# Patient Record
Sex: Male | Born: 1960 | Race: White | Hispanic: No | Marital: Married | Smoking: Never smoker
Health system: Southern US, Community
[De-identification: ages and names within clinical notes are randomized; demographics above are authoritative.]

## PROBLEM LIST (undated history)

## (undated) DIAGNOSIS — S83241A Other tear of medial meniscus, current injury, right knee, initial encounter: Secondary | ICD-10-CM

## (undated) DIAGNOSIS — T7840XA Allergy, unspecified, initial encounter: Secondary | ICD-10-CM

## (undated) DIAGNOSIS — K5150 Left sided colitis without complications: Secondary | ICD-10-CM

## (undated) DIAGNOSIS — K529 Noninfective gastroenteritis and colitis, unspecified: Secondary | ICD-10-CM

## (undated) DIAGNOSIS — M25571 Pain in right ankle and joints of right foot: Secondary | ICD-10-CM

## (undated) HISTORY — PX: NASAL SINUS SURGERY: SHX719

## (undated) HISTORY — DX: Left sided colitis without complications: K51.50

## (undated) HISTORY — DX: Noninfective gastroenteritis and colitis, unspecified: K52.9

## (undated) HISTORY — PX: LYMPH NODE BIOPSY: SHX201

## (undated) HISTORY — PX: TOOTH EXTRACTION: SUR596

## (undated) HISTORY — PX: TONSILLECTOMY: SUR1361

## (undated) HISTORY — PX: MOUTH SURGERY: SHX715

## (undated) HISTORY — DX: Allergy, unspecified, initial encounter: T78.40XA

## (undated) HISTORY — DX: Pain in right ankle and joints of right foot: M25.571

## (undated) HISTORY — PX: COLONOSCOPY: SHX174

---

## 2001-09-25 ENCOUNTER — Encounter: Admission: RE | Admit: 2001-09-25 | Discharge: 2001-09-25 | Payer: Self-pay | Admitting: Oncology

## 2001-09-25 ENCOUNTER — Encounter (HOSPITAL_COMMUNITY): Admission: RE | Admit: 2001-09-25 | Discharge: 2001-10-25 | Payer: Self-pay | Admitting: Oncology

## 2002-09-25 ENCOUNTER — Encounter: Admission: RE | Admit: 2002-09-25 | Discharge: 2002-09-25 | Payer: Self-pay | Admitting: Oncology

## 2002-10-30 ENCOUNTER — Encounter: Admission: RE | Admit: 2002-10-30 | Discharge: 2002-10-30 | Payer: Self-pay | Admitting: Oncology

## 2002-10-30 ENCOUNTER — Encounter (HOSPITAL_COMMUNITY): Admission: RE | Admit: 2002-10-30 | Discharge: 2002-11-29 | Payer: Self-pay | Admitting: Oncology

## 2004-12-03 ENCOUNTER — Ambulatory Visit (HOSPITAL_COMMUNITY): Payer: Self-pay | Admitting: Oncology

## 2004-12-03 ENCOUNTER — Encounter (HOSPITAL_COMMUNITY): Admission: RE | Admit: 2004-12-03 | Discharge: 2005-01-02 | Payer: Self-pay | Admitting: Oncology

## 2004-12-03 ENCOUNTER — Encounter: Admission: RE | Admit: 2004-12-03 | Discharge: 2004-12-03 | Payer: Self-pay | Admitting: Oncology

## 2006-07-12 ENCOUNTER — Encounter (HOSPITAL_COMMUNITY): Admission: RE | Admit: 2006-07-12 | Discharge: 2006-08-11 | Payer: Self-pay | Admitting: Oncology

## 2006-07-12 ENCOUNTER — Encounter: Admission: RE | Admit: 2006-07-12 | Discharge: 2006-07-12 | Payer: Self-pay | Admitting: Oncology

## 2006-07-12 ENCOUNTER — Ambulatory Visit (HOSPITAL_COMMUNITY): Payer: Self-pay | Admitting: Oncology

## 2006-07-13 ENCOUNTER — Ambulatory Visit (HOSPITAL_COMMUNITY): Admission: RE | Admit: 2006-07-13 | Discharge: 2006-07-13 | Payer: Self-pay | Admitting: Oncology

## 2007-02-04 IMAGING — CT CT ABDOMEN W/ CM
2 of 5 series · 16 of 46 positions shown, 18 images · IV contrast (APPLIED)
Comparison: None.

CLINICAL DATA: 45-year-old with left flank pain for 3 months.  Patient has a history of lymphoma.
 ABDOMEN CT WITH CONTRAST:
TECHNIQUE: Multidetector CT imaging of the abdomen was performed following the standard protocol during bolus administration of intravenous contrast.
 Contrast:  100 cc Omnipaque 300
TECHNIQUE: Multidetector CT imaging of the pelvis was performed following the standard protocol during bolus administration of intravenous contrast.

[Series 2: abd/pelv with 5.0 b31f st · axial · 0.72mm/px · z∈[-462,+28]mm · 13 of 110 slices shown, 15 images]
[im 6/110  soft-tissue]
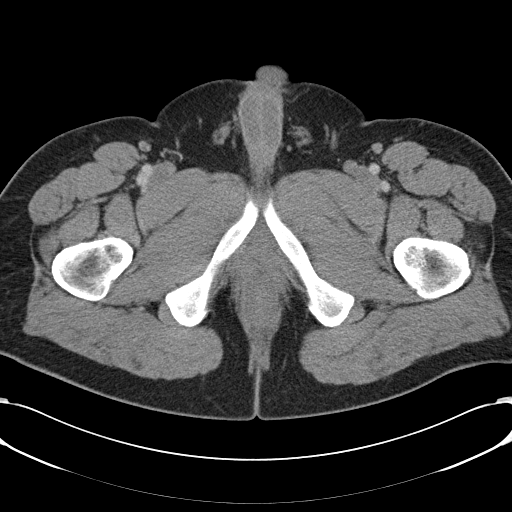
[im 6/110  bone]
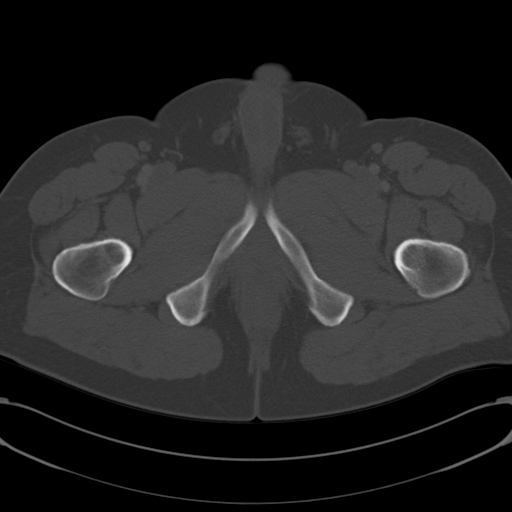
[im 18/110  soft-tissue]
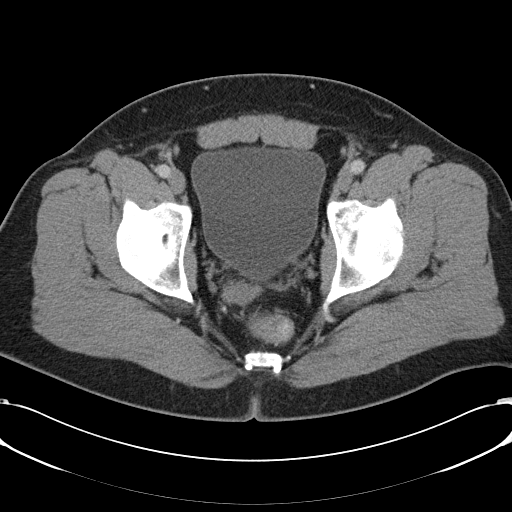
[im 23/110  soft-tissue]
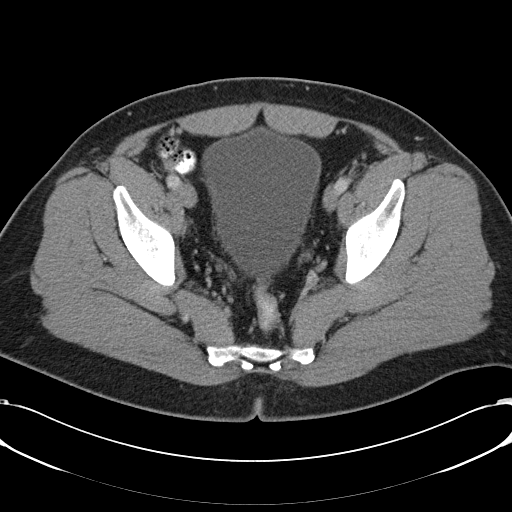
[im 29/110  soft-tissue]
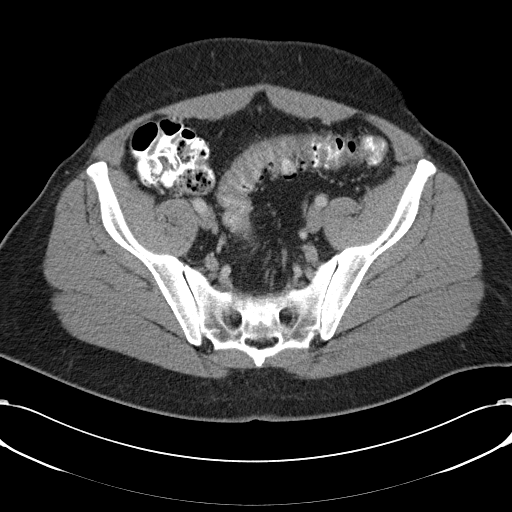
[im 41/110  soft-tissue]
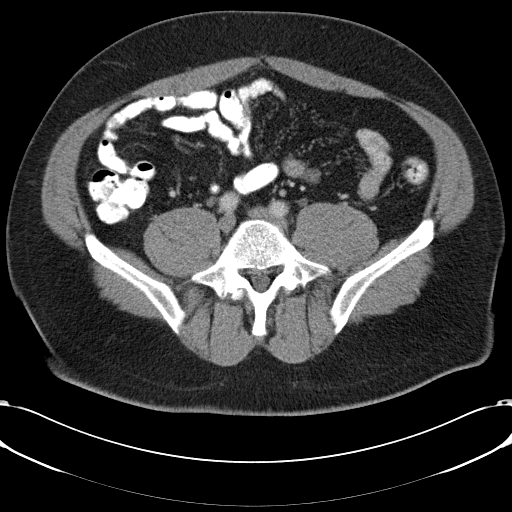
[im 46/110  soft-tissue]
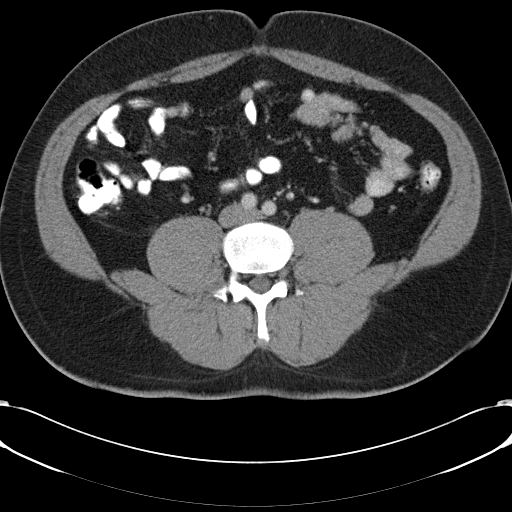
[im 58/110  soft-tissue]
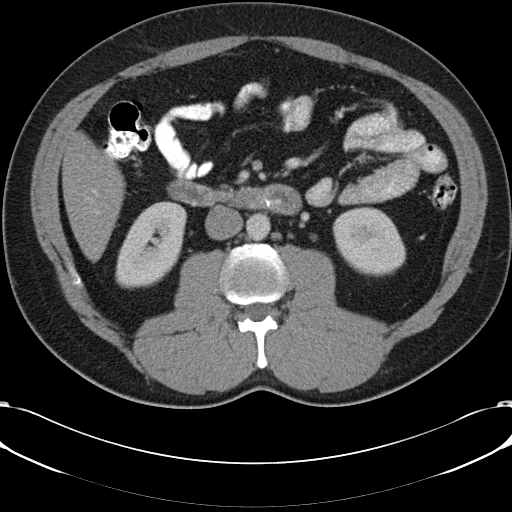
[im 64/110  soft-tissue]
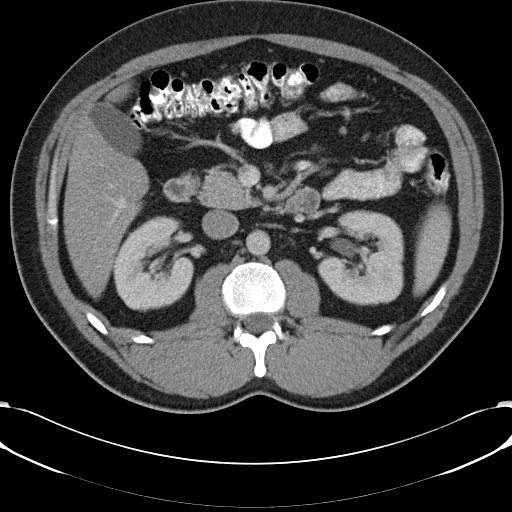
[im 69/110  soft-tissue]
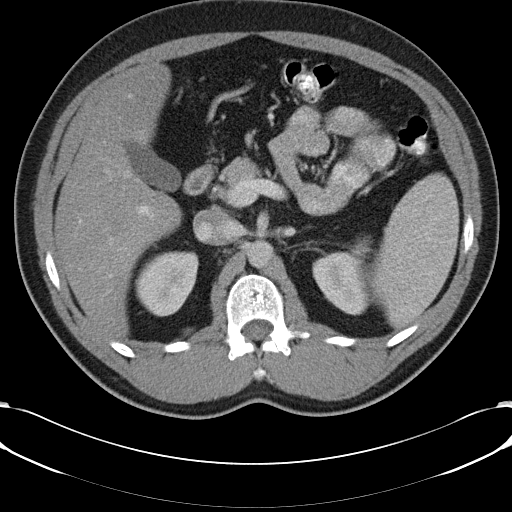
[im 69/110  bone]
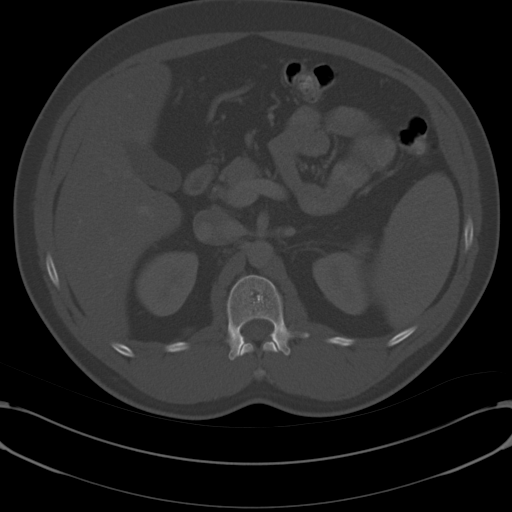
[im 81/110  soft-tissue]
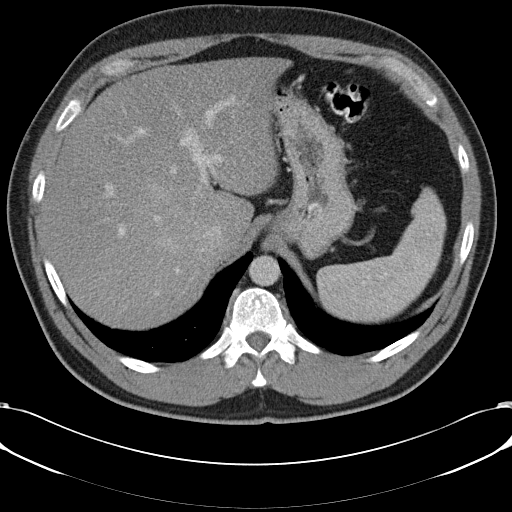
[im 87/110  soft-tissue]
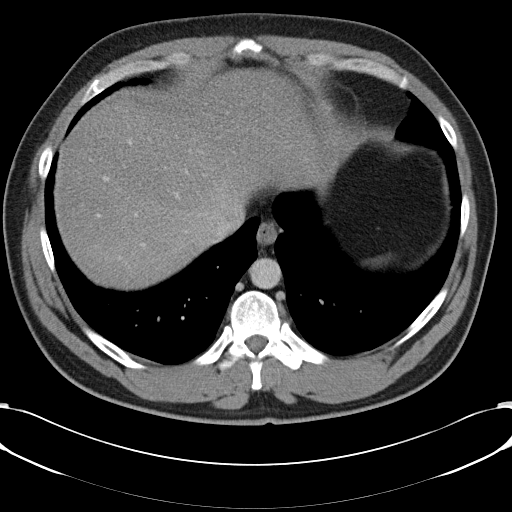
[im 92/110  soft-tissue]
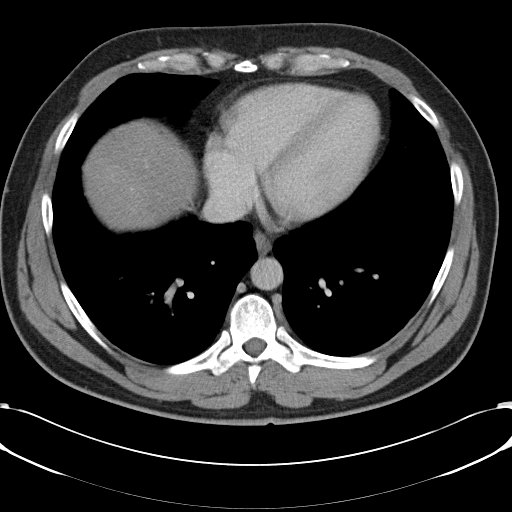
[im 104/110  soft-tissue]
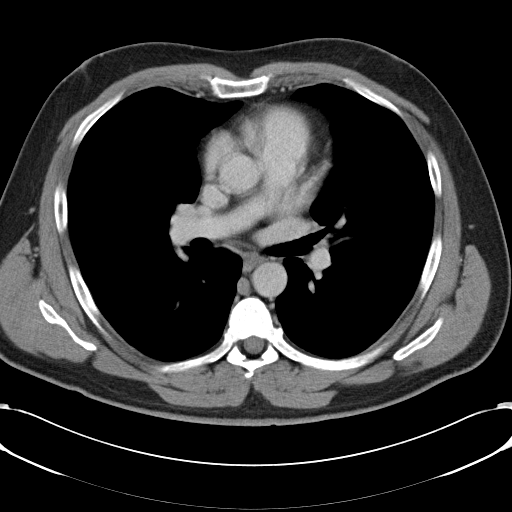

[Series 603: coronal · coronal · 1.08mm/px · 3 of 122 slices shown]
[im 41/122  soft-tissue]
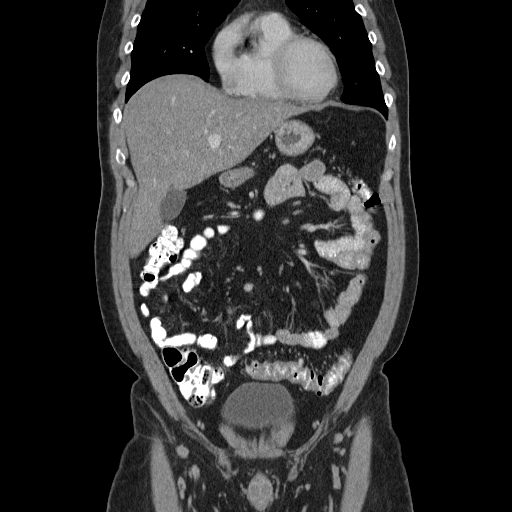
[im 54/122  soft-tissue]
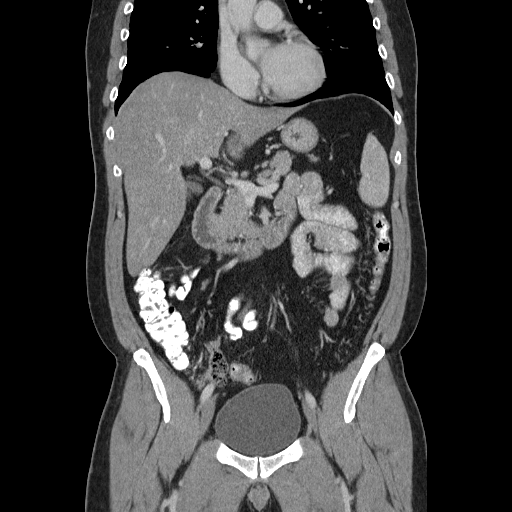
[im 68/122  soft-tissue]
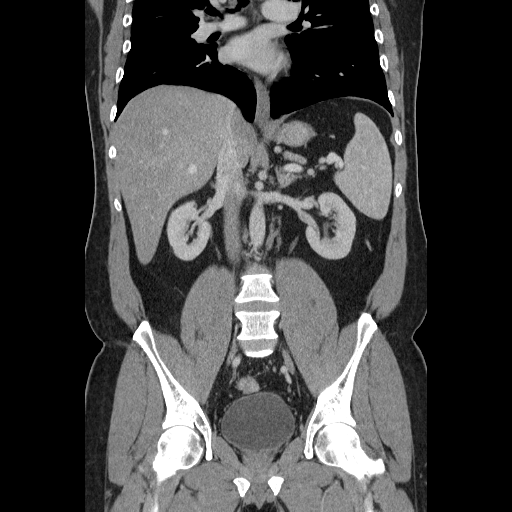

[16 of 46 positions shown; findings below may reference images not displayed]

FINDINGS: The lung bases are clear.  Heart size is normal.  No pericardial effusion.  The descending thoracic aorta is normal in caliber.  
 There is diffuse fatty infiltration of the liver with some areas of fatty sparing.  No focal hepatic lesions or intrahepatic ductal dilatation.  The spleen is within normal limits in size.  The pancreas, adrenal glands, and kidneys demonstrate no abnormalities.  
 The aorta is normal in caliber.  Major branch vessels are normal.  The portal and splenic veins are patent.  
 The stomach is not well distended with contrast, but no gross abnormalities are seen.  The duodenum, small bowel, and colon demonstrate no significant findings.  
 No mesenteric or retroperitoneal masses or adenopathy.  No significant bony findings.  There is a few small scattered hemangiomas.
IMPRESSION: Unremarkable CT examination of the abdomen.  No acute abdominal findings and no mass lesions.  
 PELVIS CT WITH CONTRAST:
FINDINGS: There is mild diffuse wall thickening involving the upper sigmoid colon with numerous diverticula.  Findings suggest mild colonic inflammation.  I do not see any discrete focus of diverticulitis.  The pericolonic fat planes are maintained.  No mass lesions are seen.  Bladder is normal.  The prostate gland and seminal vesicles appear normal.  No pelvic masses or adenopathy.  Terminal ileum appears normal.  
 No significant bony findings.
IMPRESSION: Mild diffuse inflammation of the sigmoid colon without discrete focus of diverticulitis.  No masses or adenopathy.

## 2012-06-08 ENCOUNTER — Encounter: Payer: Self-pay | Admitting: Internal Medicine

## 2012-07-24 ENCOUNTER — Encounter: Payer: Self-pay | Admitting: Internal Medicine

## 2012-07-24 ENCOUNTER — Ambulatory Visit (AMBULATORY_SURGERY_CENTER): Payer: 59

## 2012-07-24 VITALS — Ht 67.5 in | Wt 212.5 lb

## 2012-07-24 DIAGNOSIS — Z1211 Encounter for screening for malignant neoplasm of colon: Secondary | ICD-10-CM

## 2012-07-24 MED ORDER — NA SULFATE-K SULFATE-MG SULF 17.5-3.13-1.6 GM/177ML PO SOLN: 1.0000 | Freq: Once | ORAL | Status: DC

## 2012-07-26 ENCOUNTER — Telehealth: Payer: Self-pay | Admitting: Internal Medicine

## 2012-07-26 NOTE — Telephone Encounter (Signed)
Rx for Suprep called in to [facility] in [place], [place].  Pt notified

## 2012-08-06 ENCOUNTER — Encounter: Payer: Self-pay | Admitting: Internal Medicine

## 2012-08-13 ENCOUNTER — Ambulatory Visit (AMBULATORY_SURGERY_CENTER): Payer: 59 | Admitting: Internal Medicine

## 2012-08-13 ENCOUNTER — Encounter: Payer: Self-pay | Admitting: Internal Medicine

## 2012-08-13 VITALS — BP 123/86 | HR 80 | Temp 97.7°F | Resp 16 | Ht 67.5 in | Wt 212.0 lb

## 2012-08-13 DIAGNOSIS — D126 Benign neoplasm of colon, unspecified: Secondary | ICD-10-CM

## 2012-08-13 DIAGNOSIS — Z1211 Encounter for screening for malignant neoplasm of colon: Secondary | ICD-10-CM

## 2012-08-13 DIAGNOSIS — K5730 Diverticulosis of large intestine without perforation or abscess without bleeding: Secondary | ICD-10-CM

## 2012-08-13 MED ORDER — SODIUM CHLORIDE 0.9 % IV SOLN: 500.0000 mL | INTRAVENOUS | Status: DC

## 2012-08-13 NOTE — Op Note (Signed)
[facility] [address] [place], [address]   COLONOSCOPY PROCEDURE REPORT  PATIENT: Richard Carey, Richard Carey  MR#: [id number] BIRTHDATE: 1961/09/08 , 51  yrs. old GENDER: Male ENDOSCOPIST: Iva Boop, MD, [facility] REFERRED [id number] Lenise Arena, M.D. PROCEDURE DATE:  08/13/2012 PROCEDURE:   Colonoscopy with snare polypectomy ASA CLASS:   Class I INDICATIONS:average risk screening. MEDICATIONS: propofol (Diprivan) 400mg  IV, MAC sedation, administered by CRNA, and These medications were titrated to patient response per physician's verbal order  DESCRIPTION OF PROCEDURE:   After the risks benefits and alternatives of the procedure were thoroughly explained, informed consent was obtained.  A digital rectal exam revealed no abnormalities of the rectum and A digital rectal exam revealed the prostate was not enlarged.   The LB CF-[id number]  endoscope was introduced through the anus and advanced to the cecum, which was identified by both the appendix and ileocecal valve. No adverse events experienced.   The quality of the prep was Suprep good  The instrument was then slowly withdrawn as the colon was fully examined.      COLON FINDINGS: A diminutive polypoid shaped sessile polyp was found in the sigmoid colon.  A polypectomy was performed with a cold snare.  The resection was complete and the polyp tissue was partially retrieved.   Mild diverticulosis was noted in the sigmoid colon.   The colon mucosa was otherwise normal.  Retroflexed views in right colon and rectum revealed no abnormalities. The time to cecum=2 minutes 29 seconds.  Withdrawal time=14 minutes 06 seconds. The scope was withdrawn and the procedure completed. COMPLICATIONS: There were no complications.  ENDOSCOPIC IMPRESSION: 1.   Diminutive sessile polyp was found in the sigmoid colon; polypectomy was performed with a cold snare 2.   Mild diverticulosis was noted in the sigmoid colon 3.   The colon  mucosa was otherwise normal, good prep  RECOMMENDATIONS: Timing of repeat colonoscopy will be determined by pathology findings.5-10 years   eSigned:  Iva Boop, MD, [facility] 08/13/2012 4:25 PM   cc: [name], MD and The Patient

## 2012-08-13 NOTE — Progress Notes (Signed)
Propofol given and oxygen managed per J Mullens CRNA 

## 2012-08-13 NOTE — Patient Instructions (Addendum)
The colonoscopy showed one very small polyp (removed) and diverticulosis.  Please read the handouts provided.  Thank you for choosing me and [facility].  Iva Boop, MD, FACG   YOU HAD AN ENDOSCOPIC PROCEDURE TODAY AT THE [facility]: Refer to the procedure report that was given to you for any specific questions about what was found during the examination.  If the procedure report does not answer your questions, please call your gastroenterologist to clarify.  If you requested that your care partner not be given the details of your procedure findings, then the procedure report has been included in a sealed envelope for you to review at your convenience later.  YOU SHOULD EXPECT: Some feelings of bloating in the abdomen. Passage of more gas than usual.  Walking can help get rid of the air that was put into your GI tract during the procedure and reduce the bloating. If you had a lower endoscopy (such as a colonoscopy or flexible sigmoidoscopy) you may notice spotting of blood in your stool or on the toilet paper. If you underwent a bowel prep for your procedure, then you may not have a normal bowel movement for a few days.  DIET: Your first meal following the procedure should be a light meal and then it is ok to progress to your normal diet.  A half-sandwich or bowl of soup is an example of a good first meal.  Heavy or fried foods are harder to digest and may make you feel nauseous or bloated.  Likewise meals heavy in dairy and vegetables can cause extra gas to form and this can also increase the bloating.  Drink plenty of fluids but you should avoid alcoholic beverages for 24 hours.  ACTIVITY: Your care partner should take you home directly after the procedure.  You should plan to take it easy, moving slowly for the rest of the day.  You can resume normal activity the day after the procedure however you should NOT DRIVE or use heavy machinery for 24 hours (because of the  sedation medicines used during the test).    SYMPTOMS TO REPORT IMMEDIATELY: A gastroenterologist can be reached at any hour.  During normal business hours, 8:30 AM to 5:00 PM Monday through Friday, call [phone].  After hours and on weekends, please call the GI answering service at [phone] who will take a message and have the physician on call contact you.   Following lower endoscopy (colonoscopy or flexible sigmoidoscopy):  Excessive amounts of blood in the stool  Significant tenderness or worsening of abdominal pains  Swelling of the abdomen that is new, acute  Fever of 100F or higher  Following upper endoscopy (EGD)  Vomiting of blood or coffee ground material  New chest pain or pain under the shoulder blades  Painful or persistently difficult swallowing  New shortness of breath  Fever of 100F or higher  Black, tarry-looking stools  FOLLOW UP: If any biopsies were taken you will be contacted by phone or by letter within the next 1-3 weeks.  Call your gastroenterologist if you have not heard about the biopsies in 3 weeks.  Our staff will call the home number listed on your records the next business day following your procedure to check on you and address any questions or concerns that you may have at that time regarding the information given to you following your procedure. This is a courtesy call and so if there is no answer at the home number  and we have not heard from you through the emergency physician on call, we will assume that you have returned to your regular daily activities without incident.  SIGNATURES/CONFIDENTIALITY: You and/or your care partner have signed paperwork which will be entered into your electronic medical record.  These signatures attest to the fact that that the information above on your After Visit Summary has been reviewed and is understood.  Full responsibility of the confidentiality of this discharge information lies with you and/or your  care-partner.

## 2012-08-14 ENCOUNTER — Telehealth: Payer: Self-pay | Admitting: *Deleted

## 2012-08-14 NOTE — Telephone Encounter (Signed)
  Follow up Call-  Call back number 08/13/2012  Post procedure Call Back phone  # [phone]  Permission to leave phone message Yes     [facility]

## 2012-08-21 ENCOUNTER — Encounter: Payer: Self-pay | Admitting: Internal Medicine

## 2012-08-21 NOTE — Progress Notes (Signed)
Quick Note:  Hyperplastic sigmoid polyp Repeat colonoscopy 2023 ______

## 2013-04-24 ENCOUNTER — Encounter (HOSPITAL_COMMUNITY): Payer: Self-pay | Admitting: Oncology

## 2013-04-24 ENCOUNTER — Encounter (HOSPITAL_COMMUNITY): Payer: 59 | Attending: Oncology | Admitting: Oncology

## 2013-04-24 VITALS — BP 142/81 | HR 73 | Temp 98.0°F | Resp 18 | Ht 68.0 in | Wt 216.8 lb

## 2013-04-24 DIAGNOSIS — D1779 Benign lipomatous neoplasm of other sites: Secondary | ICD-10-CM

## 2013-04-24 DIAGNOSIS — R229 Localized swelling, mass and lump, unspecified: Secondary | ICD-10-CM

## 2013-04-24 NOTE — Progress Notes (Signed)
This is a very pleasant gentleman who I have known intermittently for many years. 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 It was not clear whether this was really cancer versus atypical cells, that were reactive. No therapy was administered. He was observed only and no recurrences surfaced.  I have not seen him since 2007. 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 He is here today because he is concerned about it changed.  He has no positive findings otherwise on review of systems. He is overweight for her his height. He does need to lose weight. He states that once a glucose was slightly elevated. His family physician has been discussing weight reduction etc. with him. Both his wife and his daughter are overweight as well he states of the need to have a change in lifestyle realistically.  He is working full-time. He has no B. symptomatology. He had colonoscopy last year which was unremarkable. Other than very rarely and on social occasions only.  Parents are both still alive. His father has stage 0 CLL he tells me today and peripheral neuropathy. His mother is in good health. He also has a sister in good health who lives in [place].  BP 142/81  Pulse 73  Temp(Src) 98 F (36.7 C) (Oral)  Resp 18  Ht 5\' 8"  (1.727 m)  Wt 216 lb 12.8 oz (98.34 kg)  BMI 32.97 kg/m2  He is in no acute distress. He has no palpable lymphadenopathy in the cervical, supraclavicular, infraclavicular, or axillary areas. I believe he has a right sided 6-7 mm lipoma-like lesion medial to the nipple areolar complex on the right. He has a 1.5 x 1.0 cm left upper abdominal lipoma. There is a questionable periumbilical 5-6 mm nodule suspicious for a lipoma. He does not have hepatosplenomegaly. Bowel sounds are normal. Lungs are clear to auscultation and percussion. Heart exam shows a  regular rhythm and rate without murmur rub or gallop. Abdomen is soft and nontender. Bowel sounds are diminished but present. He has no arm or leg edema. Pupils are equally round and reactive to light. Teeth are in good repair. Throat is clear. Facial symmetry is intact. There is no thyromegaly. Skin exam otherwise is negative.  I do not think there is anything to worry about with this gentleman at this time. He does need to lose weight and exercise. If he thinks things are changing in his abdominal wall, he is free to come back at any time. I do not think blood work is indicated at this time.

## 2013-04-24 NOTE — Patient Instructions (Addendum)
[facility] Discharge Instructions  RECOMMENDATIONS MADE BY THE CONSULTANT AND ANY TEST RESULTS WILL BE SENT TO YOUR REFERRING PHYSICIAN.  EXAM FINDINGS BY THE PHYSICIAN TODAY AND SIGNS OR SYMPTOMS TO REPORT TO CLINIC OR PRIMARY PHYSICIAN: Exam and discussion by MD.  Area is a lipoma and don't need to do anything.  MEDICATIONS PRESCRIBED:  none    SPECIAL INSTRUCTIONS/FOLLOW-UP: Follow-up as needed.  Thank you for choosing [facility] to provide your oncology and hematology care.  To afford each patient quality time with our providers, please arrive at least 15 minutes before your scheduled appointment time.  With your help, our goal is to use those 15 minutes to complete the necessary work-up to ensure our physicians have the information they need to help with your evaluation and healthcare recommendations.    Effective January 1st, 2014, we ask that you re-schedule your appointment with our physicians should you arrive 10 or more minutes late for your appointment.  We strive to give you quality time with our providers, and arriving late affects you and other patients whose appointments are after yours.    Again, thank you for choosing [facility].  Our hope is that these requests will decrease the amount of time that you wait before being seen by our physicians.       _____________________________________________________________  Should you have questions after your visit to [facility], please contact our office at [phone] between the hours of 8:30 a.m. and 5:00 p.m.  Voicemails left after 4:30 p.m. will not be returned until the following business day.  For prescription refill requests, have your pharmacy contact our office with your prescription refill request.

## 2014-02-15 ENCOUNTER — Emergency Department (HOSPITAL_BASED_OUTPATIENT_CLINIC_OR_DEPARTMENT_OTHER)
Admission: EM | Admit: 2014-02-15 | Discharge: 2014-02-15 | Disposition: A | Discharge status: Home / Self Care | Payer: 59 | Attending: Emergency Medicine | Admitting: Emergency Medicine

## 2014-02-15 ENCOUNTER — Encounter (HOSPITAL_BASED_OUTPATIENT_CLINIC_OR_DEPARTMENT_OTHER): Payer: Self-pay | Admitting: Emergency Medicine

## 2014-02-15 DIAGNOSIS — Y929 Unspecified place or not applicable: Secondary | ICD-10-CM | POA: Insufficient documentation

## 2014-02-15 DIAGNOSIS — IMO0002 Reserved for concepts with insufficient information to code with codable children: Secondary | ICD-10-CM | POA: Insufficient documentation

## 2014-02-15 DIAGNOSIS — Z8619 Personal history of other infectious and parasitic diseases: Secondary | ICD-10-CM | POA: Insufficient documentation

## 2014-02-15 DIAGNOSIS — S61219A Laceration without foreign body of unspecified finger without damage to nail, initial encounter: Secondary | ICD-10-CM

## 2014-02-15 DIAGNOSIS — S61209A Unspecified open wound of unspecified finger without damage to nail, initial encounter: Secondary | ICD-10-CM | POA: Insufficient documentation

## 2014-02-15 DIAGNOSIS — W298XXA Contact with other powered powered hand tools and household machinery, initial encounter: Secondary | ICD-10-CM | POA: Insufficient documentation

## 2014-02-15 DIAGNOSIS — Z79899 Other long term (current) drug therapy: Secondary | ICD-10-CM | POA: Insufficient documentation

## 2014-02-15 DIAGNOSIS — Z23 Encounter for immunization: Secondary | ICD-10-CM | POA: Insufficient documentation

## 2014-02-15 DIAGNOSIS — Y9389 Activity, other specified: Secondary | ICD-10-CM | POA: Insufficient documentation

## 2014-02-15 MED ORDER — TETANUS-DIPHTH-ACELL PERTUSSIS 5-2.5-18.5 LF-MCG/0.5 IM SUSP
0.5000 mL | Freq: Once | INTRAMUSCULAR | Status: AC
  Administered 2014-02-15: 0.5 mL via INTRAMUSCULAR
  Filled 2014-02-15: qty 0.5

## 2014-02-15 MED ORDER — CLINDAMYCIN HCL 300 MG PO CAPS: 300.0000 mg | ORAL_CAPSULE | Freq: Four times a day (QID) | ORAL | Status: DC

## 2014-02-15 NOTE — Discharge Instructions (Signed)
Bacitracin dressings twice daily.  Clindamycin as prescribed.  Return to the ER for signs of infection including increased redness, pus draining from wound, streaks up the arm, or any new or concerning symptoms.   Laceration Care, Adult A laceration is a cut or lesion that goes through all layers of the skin and into the tissue just beneath the skin. TREATMENT  Some lacerations may not require closure. Some lacerations may not be able to be closed due to an increased risk of infection. It is important to see your caregiver as soon as possible after an injury to minimize the risk of infection and maximize the opportunity for successful closure. If closure is appropriate, pain medicines may be given, if needed. The wound will be cleaned to help prevent infection. Your caregiver will use stitches (sutures), staples, wound glue (adhesive), or skin adhesive strips to repair the laceration. These tools bring the skin edges together to allow for faster healing and a better cosmetic outcome. However, all wounds will heal with a scar. Once the wound has healed, scarring can be minimized by covering the wound with sunscreen during the day for 1 full year. HOME CARE INSTRUCTIONS  For sutures or staples:  Keep the wound clean and dry.  If you were given a bandage (dressing), you should change it at least once a day. Also, change the dressing if it becomes wet or dirty, or as directed by your caregiver.  Wash the wound with soap and water 2 times a day. Rinse the wound off with water to remove all soap. Pat the wound dry with a clean towel.  After cleaning, apply a thin layer of the antibiotic ointment as recommended by your caregiver. This will help prevent infection and keep the dressing from sticking.  You may shower as usual after the first 24 hours. Do not soak the wound in water until the sutures are removed.  Only take over-the-counter or prescription medicines for pain, discomfort, or fever as  directed by your caregiver.  Get your sutures or staples removed as directed by your caregiver. For skin adhesive strips:  Keep the wound clean and dry.  Do not get the skin adhesive strips wet. You may bathe carefully, using caution to keep the wound dry.  If the wound gets wet, pat it dry with a clean towel.  Skin adhesive strips will fall off on their own. You may trim the strips as the wound heals. Do not remove skin adhesive strips that are still stuck to the wound. They will fall off in time. For wound adhesive:  You may briefly wet your wound in the shower or bath. Do not soak or scrub the wound. Do not swim. Avoid periods of heavy perspiration until the skin adhesive has fallen off on its own. After showering or bathing, gently pat the wound dry with a clean towel.  Do not apply liquid medicine, cream medicine, or ointment medicine to your wound while the skin adhesive is in place. This may loosen the film before your wound is healed.  If a dressing is placed over the wound, be careful not to apply tape directly over the skin adhesive. This may cause the adhesive to be pulled off before the wound is healed.  Avoid prolonged exposure to sunlight or tanning lamps while the skin adhesive is in place. Exposure to ultraviolet light in the first year will darken the scar.  The skin adhesive will usually remain in place for 5 to 10 days, then naturally  fall off the skin. Do not pick at the adhesive film. You may need a tetanus shot if:  You cannot remember when you had your last tetanus shot.  You have never had a tetanus shot. If you get a tetanus shot, your arm may swell, get red, and feel warm to the touch. This is common and not a problem. If you need a tetanus shot and you choose not to have one, there is a rare chance of getting tetanus. Sickness from tetanus can be serious. SEEK MEDICAL CARE IF:   You have redness, swelling, or increasing pain in the wound.  You see a red  line that goes away from the wound.  You have yellowish-white fluid (pus) coming from the wound.  You have a fever.  You notice a bad smell coming from the wound or dressing.  Your wound breaks open before or after sutures have been removed.  You notice something coming out of the wound such as wood or glass.  Your wound is on your hand or foot and you cannot move a finger or toe. SEEK IMMEDIATE MEDICAL CARE IF:   Your pain is not controlled with prescribed medicine.  You have severe swelling around the wound causing pain and numbness or a change in color in your arm, hand, leg, or foot.  Your wound splits open and starts bleeding.  You have worsening numbness, weakness, or loss of function of any joint around or beyond the wound.  You develop painful lumps near the wound or on the skin anywhere on your body. MAKE SURE YOU:   Understand these instructions.  Will watch your condition.  Will get help right away if you are not doing well or get worse. Document Released: 11/07/2005 Document Revised: 01/30/2012 Document Reviewed: 05/03/2011 [facility] Patient Information 2014 [place], [place].

## 2014-02-15 NOTE — ED Notes (Signed)
Pt cut right pinky finger with chainsaw

## 2014-02-15 NOTE — ED Provider Notes (Signed)
CSN: [id number]     Arrival date & time 02/15/14  2000 History  This chart was scribed for [name] found by Terressa Koyanagi, ED Scribe. This patient was seen in room MH09/MH09 and the patient's care was started at 9:45 PM.  PCP: Orpah Melter, MD   Chief Complaint  Patient presents with  . Extremity Laceration   The history is provided by the patient. No language interpreter was used.   HPI Comments: Richard Carey is a 53 y.o. male who presents to the Emergency Department complaining of a laceration to his right fifth finger onset earlier this evening. Pt reports that he lacerated his right fifth finger when he was cleaning his chainsaw earlier this evening. Pt reports his last tetanus shot was in 2004. Pt is currently on prednisone for a finger infection.   Past Medical History  Diagnosis Date  . Ankle pain, right    Past Surgical History  Procedure Laterality Date  . Tooth extraction    . Tonsillectomy    . Nasal sinus surgery      sinus scraped /1991  . Lymph node biopsy      left side neck   Family History  Problem Relation Age of Onset  . Aneurysm Paternal Grandfather    History  Substance Use Topics  . Smoking status: Never Smoker   . Smokeless tobacco: Never Used  . Alcohol Use: No    Review of Systems  Skin: Positive for wound (laceration of right fifth finger).  All other systems reviewed and are negative.   A complete 10 system review of systems was obtained and all systems are negative except as noted in the HPI and PMH.    Allergies  Keflex  Home Medications   Current Outpatient Rx  Name  Route  Sig  Dispense  Refill  . diphenhydramine-acetaminophen (TYLENOL PM) 25-500 MG TABS   Oral   Take 1 tablet by mouth at bedtime as needed.         . fish oil-omega-3 fatty acids 1000 MG capsule   Oral   Take 1,400 mg by mouth daily.         Marland Kitchen glucosamine-chondroitin 500-400 MG tablet   Oral   Take 1 tablet by mouth 2 (two) times daily.          . Multiple Vitamin (MULTIVITAMIN) tablet   Oral   Take 1 tablet by mouth daily. Complete  MVI-Take one daily         . predniSONE (STERAPRED UNI-PAK) 10 MG tablet   Oral   Take by mouth daily.         . CHROMIUM-CINNAMON PO   Oral   Take 500 mg by mouth 2 (two) times daily.         . niacin 500 MG tablet   Oral   Take 500 mg by mouth 2 (two) times daily.         . Potassium Gluconate 595 MG CAPS   Oral   Take by mouth 2 (two) times daily.          BP 124/72  Pulse 89  Temp(Src) 98.3 F (36.8 C) (Oral)  Resp 18  Ht 5\' 8"  (1.727 m)  Wt 220 lb (99.791 kg)  BMI 33.46 kg/m2  SpO2 96% Physical Exam  Nursing note and vitals reviewed. Constitutional: He is oriented to person, place, and time. He appears well-developed and well-nourished. No distress.  HENT:  Head: Normocephalic and atraumatic.  Eyes:  EOM are normal.  Neck: Neck supple. No tracheal deviation present.  Cardiovascular: Normal rate.   Pulmonary/Chest: Effort normal. No respiratory distress.  Musculoskeletal: Normal range of motion.  Right fifth finger is noted to have multiple, superficial, lacerations to the lateral aspect. No tendon involvement. Capillary refill is brisk.   Neurological: He is alert and oriented to person, place, and time.  Skin: Skin is warm and dry.  Psychiatric: He has a normal mood and affect. His behavior is normal.    ED Course  Procedures (including critical care time) DIAGNOSTIC STUDIES: Oxygen Saturation is 96% on RA, adequate by my interpretation.    COORDINATION OF CARE:  9:48 PM-Discussed treatment plan which includes wound care and antibiotics with pt at bedside and pt agreed to plan.   Labs Review Labs Reviewed - No data to display Imaging Review No results found.   EKG Interpretation None      MDM   Final diagnoses:  None    Patient is a 53 year old male presents with lacerations to the right fifth finger he sustained while repairing a  chainsaw. The finger has several superficial jagged lacerations that do not require suture. The wounds were cleansed and dressed and should heal up just fine. He will be prescribed an antibiotic due to the dirty nature of the wound and advised to return if he develops additional signs of infection or other problems. His tetanus was updated.  I personally performed the services described in this documentation, which was scribed in my presence. The recorded information has been reviewed and is accurate.      Veryl Speak, MD 02/15/14 [phone]

## 2015-08-31 ENCOUNTER — Other Ambulatory Visit: Payer: Self-pay | Admitting: Orthopedic Surgery

## 2015-09-02 ENCOUNTER — Encounter (HOSPITAL_BASED_OUTPATIENT_CLINIC_OR_DEPARTMENT_OTHER): Payer: Self-pay | Admitting: *Deleted

## 2015-09-08 NOTE — H&P (Signed)
Richard Carey is an 54 y.o. male.    Chief Complaint: Right Knee Pain  HPI: Patient is here today for followup on right knee pain.  He was last seen on 07/30/15 at which time we elected to order an MRI for suspected medial meniscus tear.  He is here today to review the results of the MRI.  He states that he's had continued symptoms in the right knee.  He denies any new injury.  He does mention that when he is in [place] last week he did tweak his knee again and it hurt for quite a while.  He is currently wearing a hinged knee brace and also use an Ace single-point cane for assistance.  He does do a lot of traveling for his job and will be in [place] next week.  He denies any fevers chills night sweats or other signs of infection.  Past Medical History  Diagnosis Date  . Ankle pain, right   . Acute medial meniscus tear of right knee     Past Surgical History  Procedure Laterality Date  . Tooth extraction    . Tonsillectomy    . Nasal sinus surgery      sinus scraped /1991  . Lymph node biopsy      left side neck  . Mouth surgery      Family History  Problem Relation Age of Onset  . Aneurysm Paternal Grandfather    Social History:  reports that he has never smoked. He has never used smokeless tobacco. He reports that he drinks alcohol. He reports that he does not use illicit drugs.  Allergies:  Allergies  Allergen Reactions  . Keflex [Cephalexin] Hives    No prescriptions prior to admission    No results found for this or any previous visit (from the past 59 hour(s)). No results found.  Review of Systems  Constitutional: Negative.   HENT: Positive for tinnitus.   Eyes: Negative.   Respiratory: Negative.   Cardiovascular: Negative.   Gastrointestinal: Positive for diarrhea.  Genitourinary: Negative.   Musculoskeletal: Positive for joint pain.  Skin: Negative.   Neurological: Negative.   Endo/Heme/Allergies: Negative.   Psychiatric/Behavioral: Negative.      Height 5\' 8"  (1.727 m), weight 102.059 kg (225 lb). Physical Exam  Constitutional: He is oriented to person, place, and time. He appears well-developed and well-nourished.  HENT:  Head: Normocephalic and atraumatic.  Eyes: Pupils are equal, round, and reactive to light.  Neck: Normal range of motion. Neck supple.  Cardiovascular: Intact distal pulses.   Respiratory: Effort normal.  Musculoskeletal: He exhibits tenderness.  he has continued tenderness over the medial joint line.  No instability.  His calves are soft and nontender.  Again continued pain with extremes of motion.  Neurological: He is alert and oriented to person, place, and time.  Skin: Skin is warm and dry.  Psychiatric: He has a normal mood and affect. His behavior is normal. Judgment and thought content normal.     Assessment/Plan Assess: Right knee medial meniscal tear  Plan: Treatment options are discussed with the patient.  This patient is also discussed with Dr. Mayer Camel who also discusses treatment options.  He wishes to proceed with surgical intervention.  The benefits risks and potential competitions of surgery are discussed.  We anticipate seeing the patient back at time of surgical intervention.  A posting slip is completed and he is to do discuss timing with Sandi Raveling our surgery scheduler.  Call with any  issues.  Again we anticipate seeing the patient back at time of surgical intervention. Shyler Holzman R 09/08/2015, 12:37 PM

## 2015-09-09 ENCOUNTER — Encounter (HOSPITAL_BASED_OUTPATIENT_CLINIC_OR_DEPARTMENT_OTHER)
Admission: RE | Disposition: A | Discharge status: Home / Self Care | Payer: Self-pay | Source: Ambulatory Visit | Attending: Orthopedic Surgery

## 2015-09-09 ENCOUNTER — Ambulatory Visit (HOSPITAL_BASED_OUTPATIENT_CLINIC_OR_DEPARTMENT_OTHER)
Admission: RE | Admit: 2015-09-09 | Discharge: 2015-09-09 | Disposition: A | Discharge status: Home / Self Care | Payer: 59 | Source: Ambulatory Visit | Attending: Orthopedic Surgery | Admitting: Orthopedic Surgery

## 2015-09-09 ENCOUNTER — Ambulatory Visit (HOSPITAL_BASED_OUTPATIENT_CLINIC_OR_DEPARTMENT_OTHER): Payer: 59 | Admitting: Anesthesiology

## 2015-09-09 ENCOUNTER — Encounter (HOSPITAL_BASED_OUTPATIENT_CLINIC_OR_DEPARTMENT_OTHER): Payer: Self-pay | Admitting: Anesthesiology

## 2015-09-09 DIAGNOSIS — E669 Obesity, unspecified: Secondary | ICD-10-CM | POA: Insufficient documentation

## 2015-09-09 DIAGNOSIS — Z6834 Body mass index (BMI) 34.0-34.9, adult: Secondary | ICD-10-CM | POA: Insufficient documentation

## 2015-09-09 DIAGNOSIS — M2341 Loose body in knee, right knee: Secondary | ICD-10-CM | POA: Insufficient documentation

## 2015-09-09 DIAGNOSIS — S83241A Other tear of medial meniscus, current injury, right knee, initial encounter: Secondary | ICD-10-CM | POA: Diagnosis present

## 2015-09-09 DIAGNOSIS — X58XXXA Exposure to other specified factors, initial encounter: Secondary | ICD-10-CM | POA: Insufficient documentation

## 2015-09-09 DIAGNOSIS — M2241 Chondromalacia patellae, right knee: Secondary | ICD-10-CM | POA: Insufficient documentation

## 2015-09-09 HISTORY — DX: Other tear of medial meniscus, current injury, right knee, initial encounter: S83.241A

## 2015-09-09 HISTORY — PX: KNEE ARTHROSCOPY: SHX127

## 2015-09-09 SURGERY — ARTHROSCOPY, KNEE
Anesthesia: General | Site: Knee | Laterality: Right

## 2015-09-09 MED ORDER — ONDANSETRON HCL 4 MG/2ML IJ SOLN
INTRAMUSCULAR | Status: DC | PRN
  Administered 2015-09-09: 4 mg via INTRAVENOUS

## 2015-09-09 MED ORDER — DEXAMETHASONE SODIUM PHOSPHATE 10 MG/ML IJ SOLN
INTRAMUSCULAR | Status: AC
  Filled 2015-09-09: qty 1

## 2015-09-09 MED ORDER — DEXAMETHASONE SODIUM PHOSPHATE 10 MG/ML IJ SOLN
INTRAMUSCULAR | Status: DC | PRN
  Administered 2015-09-09: 10 mg via INTRAVENOUS

## 2015-09-09 MED ORDER — FENTANYL CITRATE (PF) 100 MCG/2ML IJ SOLN
50.0000 ug | INTRAMUSCULAR | Status: DC | PRN
  Administered 2015-09-09: 100 ug via INTRAVENOUS

## 2015-09-09 MED ORDER — BUPIVACAINE HCL (PF) 0.5 % IJ SOLN
INTRAMUSCULAR | Status: AC
  Filled 2015-09-09: qty 30

## 2015-09-09 MED ORDER — GLYCOPYRROLATE 0.2 MG/ML IJ SOLN: 0.2000 mg | Freq: Once | INTRAMUSCULAR | Status: DC | PRN

## 2015-09-09 MED ORDER — PROPOFOL 10 MG/ML IV BOLUS
INTRAVENOUS | Status: DC | PRN
  Administered 2015-09-09: 200 mg via INTRAVENOUS

## 2015-09-09 MED ORDER — FENTANYL CITRATE (PF) 100 MCG/2ML IJ SOLN
INTRAMUSCULAR | Status: AC
  Filled 2015-09-09: qty 4

## 2015-09-09 MED ORDER — PROPOFOL 10 MG/ML IV BOLUS
INTRAVENOUS | Status: AC
  Filled 2015-09-09: qty 20

## 2015-09-09 MED ORDER — LACTATED RINGERS IV SOLN
INTRAVENOUS | Status: DC
  Administered 2015-09-09: 11:00:00 via INTRAVENOUS

## 2015-09-09 MED ORDER — HYDROCODONE-ACETAMINOPHEN 5-325 MG PO TABS: 1.0000 | ORAL_TABLET | Freq: Four times a day (QID) | ORAL | Status: DC | PRN

## 2015-09-09 MED ORDER — EPINEPHRINE HCL 1 MG/ML IJ SOLN
INTRAMUSCULAR | Status: DC | PRN
  Administered 2015-09-09: 1 mg

## 2015-09-09 MED ORDER — MIDAZOLAM HCL 2 MG/2ML IJ SOLN
INTRAMUSCULAR | Status: AC
  Filled 2015-09-09: qty 4

## 2015-09-09 MED ORDER — SODIUM CHLORIDE 0.9 % IR SOLN
Status: DC | PRN
  Administered 2015-09-09: 1000 mL

## 2015-09-09 MED ORDER — SCOPOLAMINE 1 MG/3DAYS TD PT72: 1.0000 | MEDICATED_PATCH | Freq: Once | TRANSDERMAL | Status: DC | PRN

## 2015-09-09 MED ORDER — BUPIVACAINE-EPINEPHRINE 0.5% -1:200000 IJ SOLN
INTRAMUSCULAR | Status: DC | PRN
  Administered 2015-09-09: 20 mL

## 2015-09-09 MED ORDER — BUPIVACAINE-EPINEPHRINE (PF) 0.5% -1:200000 IJ SOLN
INTRAMUSCULAR | Status: AC
  Filled 2015-09-09: qty 30

## 2015-09-09 MED ORDER — LIDOCAINE HCL (CARDIAC) 20 MG/ML IV SOLN
INTRAVENOUS | Status: AC
  Filled 2015-09-09: qty 5

## 2015-09-09 MED ORDER — LIDOCAINE HCL (CARDIAC) 20 MG/ML IV SOLN
INTRAVENOUS | Status: DC | PRN
Start: 2015-09-09 — End: 2015-09-09
  Administered 2015-09-09: 50 mg via INTRAVENOUS

## 2015-09-09 MED ORDER — CHLORHEXIDINE GLUCONATE 4 % EX LIQD: 60.0000 mL | Freq: Once | CUTANEOUS | Status: DC

## 2015-09-09 MED ORDER — EPINEPHRINE HCL 1 MG/ML IJ SOLN
INTRAMUSCULAR | Status: AC
  Filled 2015-09-09: qty 1

## 2015-09-09 MED ORDER — DEXTROSE-NACL 5-0.45 % IV SOLN: INTRAVENOUS | Status: DC

## 2015-09-09 MED ORDER — MIDAZOLAM HCL 2 MG/2ML IJ SOLN
1.0000 mg | INTRAMUSCULAR | Status: DC | PRN
  Administered 2015-09-09: 2 mg via INTRAVENOUS

## 2015-09-09 MED ORDER — VANCOMYCIN HCL IN DEXTROSE 1-5 GM/200ML-% IV SOLN
1000.0000 mg | INTRAVENOUS | Status: AC
  Administered 2015-09-09: 1000 mg via INTRAVENOUS

## 2015-09-09 MED ORDER — VANCOMYCIN HCL IN DEXTROSE 1-5 GM/200ML-% IV SOLN
INTRAVENOUS | Status: AC
  Filled 2015-09-09: qty 200

## 2015-09-09 SURGICAL SUPPLY — 38 items
BANDAGE ELASTIC 6 VELCRO ST LF (GAUZE/BANDAGES/DRESSINGS) ×2 IMPLANT
BLADE 4.2CUDA (BLADE) IMPLANT
BLADE CUTTER GATOR 3.5 (BLADE) ×2 IMPLANT
BLADE GREAT WHITE 4.2 (BLADE) IMPLANT
BNDG COHESIVE 6X5 TAN STRL LF (GAUZE/BANDAGES/DRESSINGS) ×2 IMPLANT
DRAPE ARTHROSCOPY W/POUCH 114 (DRAPES) ×2 IMPLANT
DURAPREP 26ML APPLICATOR (WOUND CARE) ×2 IMPLANT
ELECT MENISCUS 165MM 90D (ELECTRODE) IMPLANT
ELECT REM PT RETURN 9FT ADLT (ELECTROSURGICAL) ×2
ELECTRODE REM PT RTRN 9FT ADLT (ELECTROSURGICAL) ×1 IMPLANT
GAUZE SPONGE 4X4 12PLY STRL (GAUZE/BANDAGES/DRESSINGS) ×2 IMPLANT
GAUZE XEROFORM 1X8 LF (GAUZE/BANDAGES/DRESSINGS) ×2 IMPLANT
GLOVE BIO SURGEON STRL SZ7.5 (GLOVE) ×2 IMPLANT
GLOVE BIO SURGEON STRL SZ8.5 (GLOVE) ×2 IMPLANT
GLOVE BIOGEL PI IND STRL 8 (GLOVE) ×1 IMPLANT
GLOVE BIOGEL PI IND STRL 9 (GLOVE) ×1 IMPLANT
GLOVE BIOGEL PI INDICATOR 8 (GLOVE) ×1
GLOVE BIOGEL PI INDICATOR 9 (GLOVE) ×1
GOWN STRL REUS W/ TWL LRG LVL3 (GOWN DISPOSABLE) ×2 IMPLANT
GOWN STRL REUS W/TWL LRG LVL3 (GOWN DISPOSABLE) ×4
GOWN STRL REUS W/TWL XL LVL3 (GOWN DISPOSABLE) ×2 IMPLANT
IV NS IRRIG 3000ML ARTHROMATIC (IV SOLUTION) ×2 IMPLANT
KNEE WRAP E Z 3 GEL PACK (MISCELLANEOUS) ×2 IMPLANT
MANIFOLD NEPTUNE II (INSTRUMENTS) ×2 IMPLANT
NDL SAFETY ECLIPSE 18X1.5 (NEEDLE) ×1 IMPLANT
NEEDLE HYPO 18GX1.5 SHARP (NEEDLE) ×2
PACK ARTHROSCOPY DSU (CUSTOM PROCEDURE TRAY) ×2 IMPLANT
PACK BASIN DAY SURGERY FS (CUSTOM PROCEDURE TRAY) ×2 IMPLANT
PAD ALCOHOL SWAB (MISCELLANEOUS) ×2 IMPLANT
PENCIL BUTTON HOLSTER BLD 10FT (ELECTRODE) IMPLANT
SET ARTHROSCOPY TUBING (MISCELLANEOUS) ×2
SET ARTHROSCOPY TUBING LN (MISCELLANEOUS) ×1 IMPLANT
SLEEVE SCD COMPRESS KNEE MED (MISCELLANEOUS) IMPLANT
SYR 3ML 18GX1 1/2 (SYRINGE) IMPLANT
SYR 5ML LL (SYRINGE) ×2 IMPLANT
TOWEL OR 17X24 6PK STRL BLUE (TOWEL DISPOSABLE) ×2 IMPLANT
WAND STAR VAC 90 (SURGICAL WAND) IMPLANT
WATER STERILE IRR 1000ML POUR (IV SOLUTION) ×2 IMPLANT

## 2015-09-09 NOTE — Op Note (Signed)
Pre-Op Dx: Right knee medial meniscal tear  Postop Dx: Right knee posterior horn medial meniscal tear, chondral loose body   Procedure: Arthroscopic right knee partial medial meniscectomy and removal of chondral loose body 1 cm in diameter  Surgeon: Kathalene Frames. Mayer Camel M.D.  Assist: Kerry Hough. Barton Dubois  (present throughout entire procedure and necessary for timely completion of the procedure) Anes: General LMA  EBL: Minimal  Fluids: 800 cc   Indications: Catching popping and pain in the right knee for over a year, MRI shows posterior horn parrot-beak medial meniscal tear displaced.. Pt has failed conservative treatment with anti-inflammatory medicines, physical therapy, and modified activites but did get good temporarily from an intra-articular cortisone injection. Pain has recurred and patient desires elective arthroscopic evaluation and treatment of knee. Risks and benefits of surgery have been discussed and questions answered.  Procedure: Patient identified by arm band and taken to the operating room at the day surgery Center. The appropriate anesthetic monitors were attached, and General LMA anesthesia was induced without difficulty. Lateral post was applied to the table and the lower extremity was prepped and draped in usual sterile fashion from the ankle to the midthigh. Time out procedure was performed. We began the operation by making standard inferior lateral and inferior medial peripatellar portals with a #11 blade allowing introduction of the arthroscope through the inferior lateral portal and the out flow to the inferior medial portal. 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 Moving into the medial compartment we immediately identified a displaced posterior horn. The tear  of the meniscus that was removed with small straight biters upbiters and a 35 Gator sucker shaver. 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 Lateral compartment was in excellent condition. Cruciate ligaments were intact. Lateral gutter was cleared.  The knee was irrigated out normal saline solution. The portals were infiltrated with 3 mL of 1/2% Marcaine with epinephrine each, another 12 mL was placed into the joint itself. A dressing of xerofoam 4 x 4 dressing sponges, web roll and an Ace wrap was applied. The patient was awakened extubated and taken to the recovery without difficulty.    Signed: Kerin Salen, MD

## 2015-09-09 NOTE — Anesthesia Postprocedure Evaluation (Signed)
  Anesthesia Post-op Note  Patient: Richard Carey  Procedure(s) Performed: Procedure(s) (LRB): RIGHT ARTHROSCOPY KNEE (Right)  Patient Location: PACU  Anesthesia Type: General  Level of Consciousness: awake and alert   Airway and Oxygen Therapy: Patient Spontanous Breathing  Post-op Pain: mild  Post-op Assessment: Post-op Vital signs reviewed, Patient's Cardiovascular Status Stable, Respiratory Function Stable, Patent Airway and No signs of Nausea or vomiting  Last Vitals:  Filed Vitals:   09/09/15 1215  BP:   Pulse: 71  Temp:   Resp: 19    Post-op Vital Signs: stable   Complications: No apparent anesthesia complications

## 2015-09-09 NOTE — Discharge Instructions (Addendum)
Knee Arthroscopy Knee arthroscopy is a surgical procedure that is used to examine the inside of your knee joint and repair any damage. The surgeon puts a small, lighted instrument with a camera on the tip (arthroscope) through a small incision in your knee. The camera sends pictures to a monitor in the operating room. Your surgeon uses those pictures to guide the surgical instruments through other incisions to the area of damage. Knee arthroscopy can be used to treat many types of knee problems. It may be used:  To repair a torn ligament.  To repair or remove damaged tissue.  To remove a fluid-filled sac (cyst) from your knee. LET [facility] CARE PROVIDER KNOW ABOUT:  Any allergies you have.  All medicines you are taking, including vitamins, herbs, eye drops, creams, and over-the-counter medicines.  Previous problems you or members of your family have had with the use of anesthetics.  Any blood disorders you have.  Previous surgeries you have had.  Any medical conditions you may have. RISKS AND COMPLICATIONS Generally, this is a safe procedure. However, problems may occur, including:  Infection.  Bleeding.  Damage to blood vessels, nerves, or structures of your knee.  A blood clot that forms in your leg and travels to your lung.  Failure to relieve symptoms. BEFORE THE PROCEDURE  Ask your health care provider about:  Changing or stopping your regular medicines. This is especially important if you are taking diabetes medicines or blood thinners.  Taking medicines such as aspirin and ibuprofen. These medicines can thin your blood. Do not take these medicines before your procedure if your health care provider instructs you not to.  Follow your health care provider's instructions about eating or drinking restrictions.  Plan to have someone take you home after the procedure.  If you go home right after the procedure, plan to have someone with you for 24 hours.  Do not  drink alcohol unless your health care provider says that you can.  Do not use any tobacco products, including cigarettes, chewing tobacco, or electronic cigarettes unless your health care provider says that you can. If you need help quitting, ask your health care provider.  You may have a physical exam. PROCEDURE  An IV tube will be inserted into one of your veins.  You will be given one or more of the following:  A medicine that helps you relax (sedative).  A medicine that numbs the area (local anesthetic).  A medicine that makes you fall asleep (general anesthetic).  A medicine that is injected into your spine that numbs the area below and slightly above the injection site (spinal anesthetic).  A medicine that is injected into an area of your body that numbs everything below the injection site (regional anesthetic).  A cuff may be placed around your upper leg to slow bleeding during the procedure.  The surgeon will make a small number of incisions around your knee.  Your knee joint will be flushed and filled with a germ-free (sterile) solution.  The arthroscope will be passed through an incision into your knee joint.  More instruments will be passed through other incisions to repair your knee as needed.  The fluid will be removed from your knee.  The incisions will be closed with adhesive strips or stitches (sutures).  A bandage (dressing) will be placed over your knee. The procedure may vary among health care providers and hospitals. AFTER THE PROCEDURE  Your blood pressure, heart rate, breathing rate and blood oxygen level  will be monitored often until the medicines you were given have worn off.  You may be given medicine for pain.  You may get crutches to help you walk without using your knee to support your body weight.  You may have to wear compression stockings. These stocking help to prevent blood clots and reduce swelling in your legs.   This information is  not intended to replace advice given to you by your health care provider. Make sure you discuss any questions you have with your health care provider.   Document Released: 11/04/2000 Document Revised: 03/24/2015 Document Reviewed: 11/03/2014 Elsevier Interactive Patient Education 2016 [place] Anesthesia Home Care Instructions  Activity: Get plenty of rest for the remainder of the day. A responsible adult should stay with you for 24 hours following the procedure.  For the next 24 hours, DO NOT: -Drive a car -Paediatric nurse -Drink alcoholic beverages -Take any medication unless instructed by your physician -Make any legal decisions or sign important papers.  Meals: Start with liquid foods such as gelatin or soup. Progress to regular foods as tolerated. Avoid greasy, spicy, heavy foods. If nausea and/or vomiting occur, drink only clear liquids until the nausea and/or vomiting subsides. Call your physician if vomiting continues.  Special Instructions/Symptoms: Your throat may feel dry or sore from the anesthesia or the breathing tube placed in your throat during surgery. If this causes discomfort, gargle with warm salt water. The discomfort should disappear within 24 hours.  If you had a scopolamine patch placed behind your ear for the management of post- operative nausea and/or vomiting:  1. The medication in the patch is effective for 72 hours, after which it should be removed.  Wrap patch in a tissue and discard in the trash. Wash hands thoroughly with soap and water. 2. You may remove the patch earlier than 72 hours if you experience unpleasant side effects which may include dry mouth, dizziness or visual disturbances. 3. Avoid touching the patch. Wash your hands with soap and water after contact with the patch.

## 2015-09-09 NOTE — Anesthesia Preprocedure Evaluation (Addendum)
Anesthesia Evaluation  Patient identified by MRN, date of birth, ID band Patient awake    Reviewed: Allergy & Precautions, NPO status , Patient's Chart, lab work & pertinent test results  History of Anesthesia Complications Negative for: history of anesthetic complications  Airway Mallampati: II  TM Distance: >3 FB Neck ROM: Full    Dental no notable dental hx. (+) Dental Advisory Given   Pulmonary neg pulmonary ROS,    Pulmonary exam normal breath sounds clear to auscultation       Cardiovascular negative cardio ROS Normal cardiovascular exam Rhythm:Regular Rate:Normal     Neuro/Psych negative neurological ROS  negative psych ROS   GI/Hepatic negative GI ROS, Neg liver ROS,   Endo/Other  obesity  Renal/GU negative Renal ROS  negative genitourinary   Musculoskeletal negative musculoskeletal ROS (+)   Abdominal   Peds negative pediatric ROS (+)  Hematology negative hematology ROS (+)   Anesthesia Other Findings   Reproductive/Obstetrics negative OB ROS                            Anesthesia Physical Anesthesia Plan  ASA: II  Anesthesia Plan: General   Post-op Pain Management:    Induction: Intravenous  Airway Management Planned: LMA  Additional Equipment:   Intra-op Plan:   Post-operative Plan: Extubation in OR  Informed Consent: I have reviewed the patients History and Physical, chart, labs and discussed the procedure including the risks, benefits and alternatives for the proposed anesthesia with the patient or authorized representative who has indicated his/her understanding and acceptance.   Dental advisory given  Plan Discussed with: CRNA  Anesthesia Plan Comments:         Anesthesia Quick Evaluation

## 2015-09-09 NOTE — Anesthesia Procedure Notes (Signed)
Procedure Name: LMA Insertion Date/Time: 09/09/2015 11:40 AM Performed by: Lieutenant Diego Pre-anesthesia Checklist: Patient identified, Emergency Drugs available, Suction available and Patient being monitored Patient Re-evaluated:Patient Re-evaluated prior to inductionOxygen Delivery Method: Circle System Utilized Preoxygenation: Pre-oxygenation with 100% oxygen Intubation Type: IV induction Ventilation: Mask ventilation without difficulty LMA: LMA inserted LMA Size: 5.0 Number of attempts: 1 Airway Equipment and Method: Bite block Placement Confirmation: positive ETCO2 and breath sounds checked- equal and bilateral Tube secured with: Tape Dental Injury: Teeth and Oropharynx as per pre-operative assessment

## 2015-09-09 NOTE — Transfer of Care (Signed)
Immediate Anesthesia Transfer of Care Note  Patient: Richard Carey  Procedure(s) Performed: Procedure(s): RIGHT ARTHROSCOPY KNEE (Right)  Patient Location: PACU  Anesthesia Type:General  Level of Consciousness: sedated  Airway & Oxygen Therapy: Patient Spontanous Breathing and Patient connected to face mask oxygen  Post-op Assessment: Report given to RN and Post -op Vital signs reviewed and stable  Post vital signs: Reviewed and stable  Last Vitals:  Filed Vitals:   09/09/15 1041  BP: 132/86  Pulse: 78  Temp: 36.8 C  Resp: 20    Complications: No apparent anesthesia complications

## 2015-09-09 NOTE — Interval H&P Note (Signed)
History and Physical Interval Note:  09/09/2015 11:34 AM  Richard Carey  has presented today for surgery, with the diagnosis of RIGHT KNEE MEDIAL MENISCUS TEAR  The various methods of treatment have been discussed with the patient and family. After consideration of risks, benefits and other options for treatment, the patient has consented to  Procedure(s): RIGHT ARTHROSCOPY KNEE (Right) as a surgical intervention .  The patient's history has been reviewed, patient examined, no change in status, stable for surgery.  I have reviewed the patient's chart and labs.  Questions were answered to the patient's satisfaction.     Kerin Salen

## 2015-09-10 ENCOUNTER — Encounter (HOSPITAL_BASED_OUTPATIENT_CLINIC_OR_DEPARTMENT_OTHER): Payer: Self-pay | Admitting: Orthopedic Surgery

## 2016-12-13 DIAGNOSIS — R6889 Other general symptoms and signs: Secondary | ICD-10-CM | POA: Diagnosis not present

## 2017-02-06 DIAGNOSIS — J069 Acute upper respiratory infection, unspecified: Secondary | ICD-10-CM | POA: Diagnosis not present

## 2017-02-06 DIAGNOSIS — L821 Other seborrheic keratosis: Secondary | ICD-10-CM | POA: Diagnosis not present

## 2017-02-06 DIAGNOSIS — L570 Actinic keratosis: Secondary | ICD-10-CM | POA: Diagnosis not present

## 2017-08-11 DIAGNOSIS — Z0000 Encounter for general adult medical examination without abnormal findings: Secondary | ICD-10-CM | POA: Diagnosis not present

## 2017-09-23 ENCOUNTER — Encounter (HOSPITAL_BASED_OUTPATIENT_CLINIC_OR_DEPARTMENT_OTHER): Payer: Self-pay | Admitting: *Deleted

## 2017-09-23 ENCOUNTER — Emergency Department (HOSPITAL_BASED_OUTPATIENT_CLINIC_OR_DEPARTMENT_OTHER)
Admission: EM | Admit: 2017-09-23 | Discharge: 2017-09-23 | Disposition: A | Discharge status: Home / Self Care | Payer: 59 | Attending: Emergency Medicine | Admitting: Emergency Medicine

## 2017-09-23 DIAGNOSIS — Y929 Unspecified place or not applicable: Secondary | ICD-10-CM | POA: Insufficient documentation

## 2017-09-23 DIAGNOSIS — Z79899 Other long term (current) drug therapy: Secondary | ICD-10-CM | POA: Diagnosis not present

## 2017-09-23 DIAGNOSIS — Y998 Other external cause status: Secondary | ICD-10-CM | POA: Diagnosis not present

## 2017-09-23 DIAGNOSIS — Y939 Activity, unspecified: Secondary | ICD-10-CM | POA: Diagnosis not present

## 2017-09-23 DIAGNOSIS — S99922A Unspecified injury of left foot, initial encounter: Secondary | ICD-10-CM | POA: Diagnosis present

## 2017-09-23 DIAGNOSIS — W450XXA Nail entering through skin, initial encounter: Secondary | ICD-10-CM | POA: Insufficient documentation

## 2017-09-23 DIAGNOSIS — Z23 Encounter for immunization: Secondary | ICD-10-CM | POA: Insufficient documentation

## 2017-09-23 DIAGNOSIS — S91332A Puncture wound without foreign body, left foot, initial encounter: Secondary | ICD-10-CM | POA: Diagnosis not present

## 2017-09-23 MED ORDER — TETANUS-DIPHTH-ACELL PERTUSSIS 5-2.5-18.5 LF-MCG/0.5 IM SUSP
0.5000 mL | Freq: Once | INTRAMUSCULAR | Status: AC
  Administered 2017-09-23: 0.5 mL via INTRAMUSCULAR
  Filled 2017-09-23: qty 0.5

## 2017-09-23 MED ORDER — CIPROFLOXACIN HCL 500 MG PO TABS
500.0000 mg | ORAL_TABLET | Freq: Once | ORAL | Status: AC
  Administered 2017-09-23: 500 mg via ORAL
  Filled 2017-09-23: qty 1

## 2017-09-23 MED ORDER — CIPROFLOXACIN HCL 500 MG PO TABS: 500.0000 mg | ORAL_TABLET | Freq: Two times a day (BID) | ORAL | 0 refills | Status: DC

## 2017-09-23 NOTE — ED Triage Notes (Signed)
Pt reports stepping on a rusty nail with his left foot.

## 2017-09-23 NOTE — ED Provider Notes (Signed)
[facility] EMERGENCY DEPARTMENT Provider Note   CSN: [id number] Arrival date & time: 09/23/17  1710     History   Chief Complaint Chief Complaint  Patient presents with  . Foreign Body    HPI Richard Carey is a 56 y.o. male.  Patient presents s/p puncture wound to plantar aspect left foot today. Nail went through shoe and into foot. Tetanus unknown. Denies other pain or injury.  Mild pain to area, constant, dull, worse w palpation.  States nail came out, no fb stayed in foot.    The history is provided by the patient.  Foreign Body  Pertinent negatives include no fever.    Past Medical History:  Diagnosis Date  . Acute medial meniscus tear of right knee   . Ankle pain, right     There are no active problems to display for this patient.   Past Surgical History:  Procedure Laterality Date  . KNEE ARTHROSCOPY Right 09/09/2015   Procedure: RIGHT ARTHROSCOPY KNEE;  Surgeon: Frederik Pear, MD;  Location: [facility];  Service: Orthopedics;  Laterality: Right;  . LYMPH NODE BIOPSY     left side neck  . MOUTH SURGERY    . NASAL SINUS SURGERY     sinus scraped /1991  . TONSILLECTOMY    . TOOTH EXTRACTION         Home Medications    Prior to Admission medications   Medication Sig Start Date End Date Taking? Authorizing Provider  Multiple Vitamin (MULTIVITAMIN) tablet Take 1 tablet by mouth daily. Complete  MVI-Take one daily    [provider]    Family History Family History  Problem Relation Age of Onset  . Aneurysm Paternal Grandfather     Social History Social History  Substance Use Topics  . Smoking status: Never Smoker  . Smokeless tobacco: Never Used  . Alcohol use Yes     Comment: social     Allergies   Keflex [cephalexin]   Review of Systems Review of Systems  Constitutional: Negative for fever.  Cardiovascular: Negative for leg swelling.  Skin: Positive for wound.  Neurological: Negative for numbness.      Physical Exam Updated Vital Signs BP (!) 146/85 (BP Location: Left Arm)   Pulse 81   Temp 98.2 F (36.8 C) (Oral)   Resp 18   Ht 1.727 m (5\' 8" )   Wt 97.6 kg (215 lb 2 oz)   SpO2 96%   BMI 32.71 kg/m   Physical Exam  Constitutional: He is oriented to person, place, and time. He appears well-developed and well-nourished. No distress.  HENT:  Head: Atraumatic.  Neck: No tracheal deviation present.  Cardiovascular: Intact distal pulses.   Pulmonary/Chest: Effort normal. No accessory muscle usage. No respiratory distress.  Musculoskeletal: He exhibits no edema.  Small puncture wound to plantar aspect left foot. No bony tenderness. Distal pulses palp. No fb seen or felt.  Neurological: He is alert and oriented to person, place, and time.  Skin: Skin is warm and dry.  Psychiatric: He has a normal mood and affect.  Nursing note and vitals reviewed.    ED Treatments / Results  Labs (all labs ordered are listed, but only abnormal results are displayed) Labs Reviewed - No data to display  EKG  EKG Interpretation None       Radiology No results found.  Procedures Procedures (including critical care time)  Medications Ordered in ED Medications  ciprofloxacin (CIPRO) tablet 500 mg (not  administered)  Tdap (BOOSTRIX) injection 0.5 mL (not administered)     Initial Impression / Assessment and Plan / ED Course  I have reviewed the triage vital signs and the nursing notes.  Pertinent labs & imaging results that were available during my care of the patient were reviewed by me and considered in my medical decision making (see chart for details).  Confirmed only allergy is keflex.   cipro po.  Wound cleaned.   Dressing.  Tetanus updated im.    Final Clinical Impressions(s) / ED Diagnoses   Final diagnoses:  None    New Prescriptions New Prescriptions   No medications on file     Lajean Saver, MD 09/23/17 1801

## 2017-09-23 NOTE — Discharge Instructions (Signed)
It was our pleasure to provide your ER care today - we hope that you feel better.  Keep wound very clean.   Take cipro (antibiotic) as prescribed.  Return to ER if worse, severe pain, increased swelling, spreading redness, fevers, other concern.

## 2018-04-09 DIAGNOSIS — L821 Other seborrheic keratosis: Secondary | ICD-10-CM | POA: Diagnosis not present

## 2018-07-19 DIAGNOSIS — R7303 Prediabetes: Secondary | ICD-10-CM | POA: Diagnosis not present

## 2018-07-19 DIAGNOSIS — E782 Mixed hyperlipidemia: Secondary | ICD-10-CM | POA: Diagnosis not present

## 2018-07-19 DIAGNOSIS — Z0000 Encounter for general adult medical examination without abnormal findings: Secondary | ICD-10-CM | POA: Diagnosis not present

## 2019-10-27 ENCOUNTER — Encounter (HOSPITAL_BASED_OUTPATIENT_CLINIC_OR_DEPARTMENT_OTHER): Payer: Self-pay | Admitting: Emergency Medicine

## 2019-10-27 ENCOUNTER — Other Ambulatory Visit: Payer: Self-pay

## 2019-10-27 ENCOUNTER — Emergency Department (HOSPITAL_BASED_OUTPATIENT_CLINIC_OR_DEPARTMENT_OTHER): Payer: 59

## 2019-10-27 ENCOUNTER — Emergency Department (HOSPITAL_BASED_OUTPATIENT_CLINIC_OR_DEPARTMENT_OTHER)
Admission: EM | Admit: 2019-10-27 | Discharge: 2019-10-27 | Disposition: A | Discharge status: Home / Self Care | Payer: 59 | Attending: Emergency Medicine | Admitting: Emergency Medicine

## 2019-10-27 DIAGNOSIS — R002 Palpitations: Secondary | ICD-10-CM | POA: Diagnosis not present

## 2019-10-27 DIAGNOSIS — Z881 Allergy status to other antibiotic agents status: Secondary | ICD-10-CM | POA: Insufficient documentation

## 2019-10-27 DIAGNOSIS — R0789 Other chest pain: Secondary | ICD-10-CM | POA: Insufficient documentation

## 2019-10-27 LAB — BASIC METABOLIC PANEL
Anion gap: 7 (ref 5–15)
BUN: 21 mg/dL — ABNORMAL HIGH (ref 6–20)
CO2: 29 mmol/L (ref 22–32)
Calcium: 9.7 mg/dL (ref 8.9–10.3)
Chloride: 104 mmol/L (ref 98–111)
Creatinine, Ser: 1.02 mg/dL (ref 0.61–1.24)
GFR calc Af Amer: >60 mL/min (ref >60)
GFR calc non Af Amer: >60 mL/min (ref >60)
Glucose, Bld: 136 mg/dL — ABNORMAL HIGH (ref 70–99)
Potassium: 4 mmol/L (ref 3.5–5.1)
Sodium: 140 mmol/L (ref 135–145)

## 2019-10-27 LAB — CBC
HCT: 43.6 % (ref 39.0–52.0)
Hemoglobin: 14.5 g/dL (ref 13.0–17.0)
MCH: 28.4 pg (ref 26.0–34.0)
MCHC: 33.3 g/dL (ref 30.0–36.0)
MCV: 85.3 fL (ref 80.0–100.0)
Platelets: 203 10*3/uL (ref 150–400)
RBC: 5.11 MIL/uL (ref 4.22–5.81)
RDW: 13 % (ref 11.5–15.5)
WBC: 4.7 10*3/uL (ref 4.0–10.5)
nRBC: 0 % (ref 0.0–0.2)

## 2019-10-27 LAB — TROPONIN I (HIGH SENSITIVITY)
Troponin I (High Sensitivity): <2 ng/L (ref <18)
Troponin I (High Sensitivity): <2 ng/L (ref <18)

## 2019-10-27 NOTE — ED Notes (Signed)
ED Provider at bedside. 

## 2019-10-27 NOTE — ED Notes (Signed)
Patient transported to X-ray 

## 2019-10-27 NOTE — ED Provider Notes (Signed)
[facility] EMERGENCY DEPARTMENT Provider Note   CSN: [id number] Arrival date & time: 10/27/19  1249     History   Chief Complaint Chief Complaint  Patient presents with  . Palpitations    HPI Richard Carey is a 58 y.o. male.  HPI: A 58 year old patient presents for evaluation of chest pain. Initial onset of pain was approximately 3-6 hours ago. The patient's chest pain is not worse with exertion. The patient's chest pain is not middle- or left-sided, is not well-localized, is not described as heaviness/pressure/tightness, is not sharp and does not radiate to the arms/jaw/neck. The patient does not complain of nausea and denies diaphoresis. The patient has no history of stroke, has no history of peripheral artery disease, has not smoked in the past 90 days, denies any history of treated diabetes, has no relevant family history of coronary artery disease (first degree relative at less than age 20), is not hypertensive, has no history of hypercholesterolemia and does not have an elevated BMI (>=30).   HPI Patient presents to the emergency room for evaluation of palpitations and chest discomfort.  Patient states last evening he had some burning discomfort in his abdomen.  He felt like it was indigestion and took some antacids.  He did not sleep very well because of that.  When he woke up in the morning he was still having some of that discomfort but he got better after he ate.  Later in the morning he started having heart pounding sensation.  He felt funny in his chest but did not have any pain.  He did have some pain in his back. He denies any diaphoresis.  No nausea.  He called his sister who is a Marine scientist and she recommended he come to the ED for evaluation.  Patient's grandparents and parents have history of coronary artery disease  but there is no history of heart disease in the immediate family under age 32 Past Medical History:  Diagnosis Date  . Acute medial meniscus tear of  right knee   . Ankle pain, right     There are no active problems to display for this patient.   Past Surgical History:  Procedure Laterality Date  . KNEE ARTHROSCOPY Right 09/09/2015   Procedure: RIGHT ARTHROSCOPY KNEE;  Surgeon: Frederik Pear, MD;  Location: [place];  Service: Orthopedics;  Laterality: Right;  . LYMPH NODE BIOPSY     left side neck  . MOUTH SURGERY    . NASAL SINUS SURGERY     sinus scraped /1991  . TONSILLECTOMY    . TOOTH EXTRACTION          Home Medications    Prior to Admission medications   Medication Sig Start Date End Date Taking? Authorizing Provider  ciprofloxacin (CIPRO) 500 MG tablet Take 1 tablet (500 mg total) by mouth 2 (two) times daily. 09/23/17   Lajean Saver, MD  Multiple Vitamin (MULTIVITAMIN) tablet Take 1 tablet by mouth daily. Complete  MVI-Take one daily    [provider]    Family History Family History  Problem Relation Age of Onset  . Aneurysm Paternal Grandfather     Social History Social History   Tobacco Use  . Smoking status: Never Smoker  . Smokeless tobacco: Never Used  Substance Use Topics  . Alcohol use: Yes    Comment: social  . Drug use: No     Allergies   Keflex [cephalexin]   Review of Systems Review of Systems  All other systems reviewed and are negative.    Physical Exam Updated Vital Signs BP 127/85   Pulse 76   Temp 98.1 F (36.7 C) (Oral)   Resp 17   Ht 1.727 m (5\' 8" )   Wt 99.8 kg   SpO2 96%   BMI 33.45 kg/m   Physical Exam Vitals signs and nursing note reviewed.  Constitutional:      General: He is not in acute distress.    Appearance: He is well-developed.  HENT:     Head: Normocephalic and atraumatic.     Right Ear: External ear normal.     Left Ear: External ear normal.  Eyes:     General: No scleral icterus.       Right eye: No discharge.        Left eye: No discharge.     Conjunctiva/sclera: Conjunctivae normal.  Neck:      Musculoskeletal: Neck supple.     Trachea: No tracheal deviation.  Cardiovascular:     Rate and Rhythm: Normal rate and regular rhythm.  Pulmonary:     Effort: Pulmonary effort is normal. No respiratory distress.     Breath sounds: Normal breath sounds. No stridor. No wheezing or rales.  Abdominal:     General: Bowel sounds are normal. There is no distension.     Palpations: Abdomen is soft.     Tenderness: There is no abdominal tenderness. There is no guarding or rebound.  Musculoskeletal:        General: No swelling, tenderness or deformity.  Skin:    General: Skin is warm and dry.     Findings: No rash.  Neurological:     Mental Status: He is alert.     Cranial Nerves: Cranial nerve deficit: no gross deficits.     Sensory: No sensory deficit.     Motor: No abnormal muscle tone or seizure activity.     Coordination: Coordination normal.      ED Treatments / Results  Labs (all labs ordered are listed, but only abnormal results are displayed) Labs Reviewed  BASIC METABOLIC PANEL - Abnormal; Notable for the following components:      Result Value   Glucose, Bld 136 (*)    BUN 21 (*)    All other components within normal limits  CBC  TROPONIN I (HIGH SENSITIVITY)  TROPONIN I (HIGH SENSITIVITY)    EKG EKG Interpretation  Date/Time:  Sunday October 27 2019 12:55:10 EST Ventricular Rate:  91 PR Interval:  168 QRS Duration: 110 QT Interval:  360 QTC Calculation: 442 R Axis:   38 Text Interpretation: Normal sinus rhythm Low voltage QRS Incomplete right bundle branch block Cannot rule out Anteroseptal infarct , age undetermined Abnormal ECG No old tracing to compare Confirmed by Dorie Rank [phone]) on 10/27/2019 1:07:06 PM   Radiology Dg Chest 2 View  Result Date: 10/27/2019 CLINICAL DATA:  Heart palpitations. EXAM: CHEST - 2 VIEW COMPARISON:  None. FINDINGS: The cardiac silhouette, mediastinal and hilar contours are within normal limits. The lungs are clear of an acute  process. No pleural effusions or pulmonary lesions. The bony thorax is intact. IMPRESSION: No acute cardiopulmonary findings. Electronically Signed   By: Marijo Sanes M.D.   On: 10/27/2019 13:50    Procedures Procedures (including critical care time)  Medications Ordered in ED Medications - No data to display   Initial Impression / Assessment and Plan / ED Course  I have reviewed the triage vital signs and the  nursing notes.  Pertinent labs & imaging results that were available during my care of the patient were reviewed by me and considered in my medical decision making (see chart for details).     HEAR Score: 2  Patient symptoms are atypical for acute coronary syndrome.  Laboratory tests EKG and chest x-ray are reassuring.  Patient is symptom-free in the emergency room.  No dysrhythmia noted on the monitor.  He has low risk heart score and initial troponin is normal.  Second troponin has been ordered and Dr. Ralene Bathe will follow up on that.  Anticipate discharge with outpatient follow-up.  We did discuss further testing with his primary care doctor such as arrangement for stress testing.  Final Clinical Impressions(s) / ED Diagnoses   Final diagnoses:  Palpitations    ED Discharge Orders    None       Dorie Rank, MD 10/27/19 1550

## 2019-10-27 NOTE — ED Triage Notes (Signed)
Pt reports indigestion at 0200 this am with radiating pain in upper back. Pt also reports palpitations that started when getting up this am. Pt denies N/V, denies CP, denies sob

## 2019-10-27 NOTE — Discharge Instructions (Signed)
Follow-up with your primary care doctor to discuss further evaluation.  Return to the emergency room as needed for worsening symptoms

## 2020-05-20 IMAGING — CR DG CHEST 2V
2 series · 2 of 2 positions shown · non-contrast
Comparison: None.

CLINICAL DATA: Heart palpitations.

EXAM:
CHEST - 2 VIEW

[w chest pa]
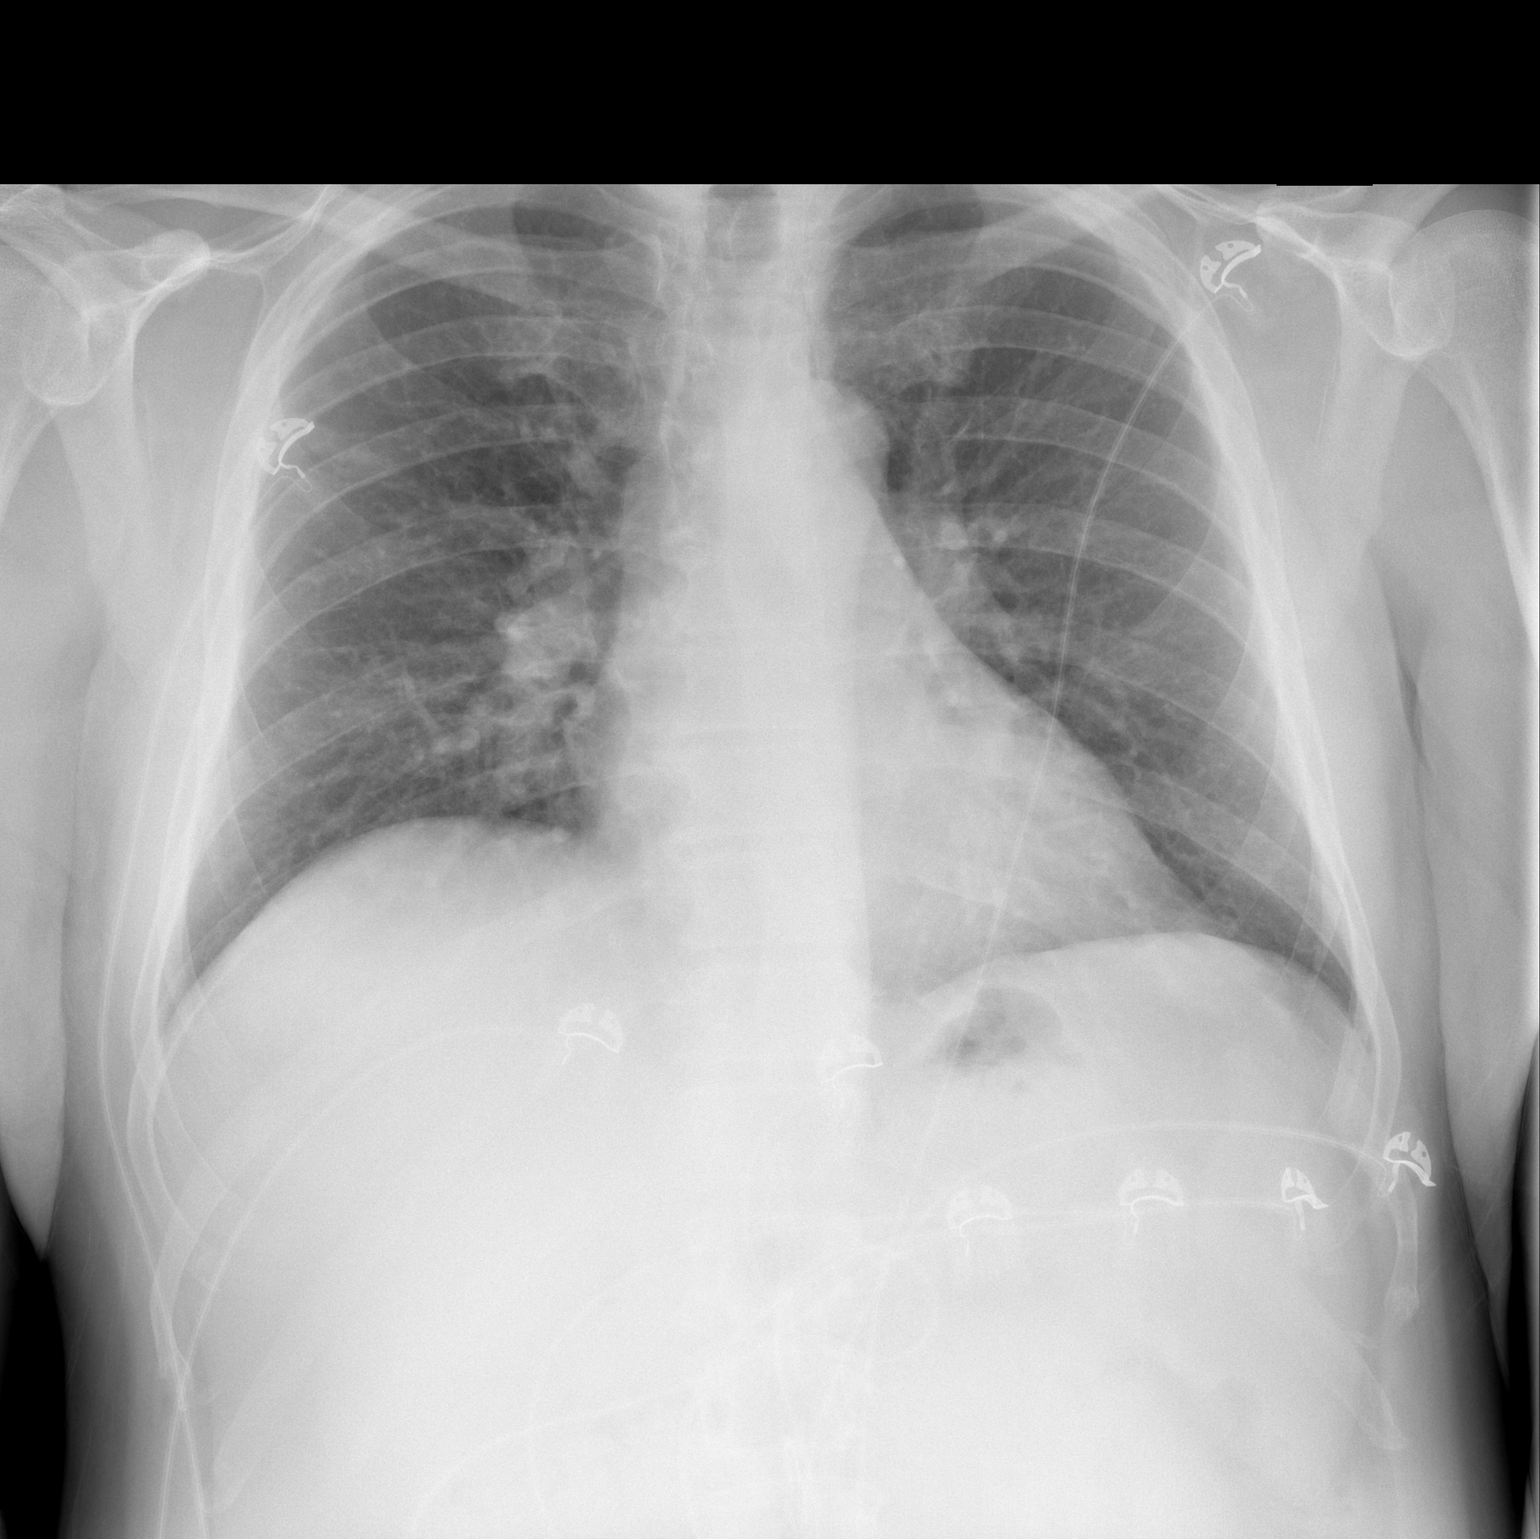

[w chest lat]
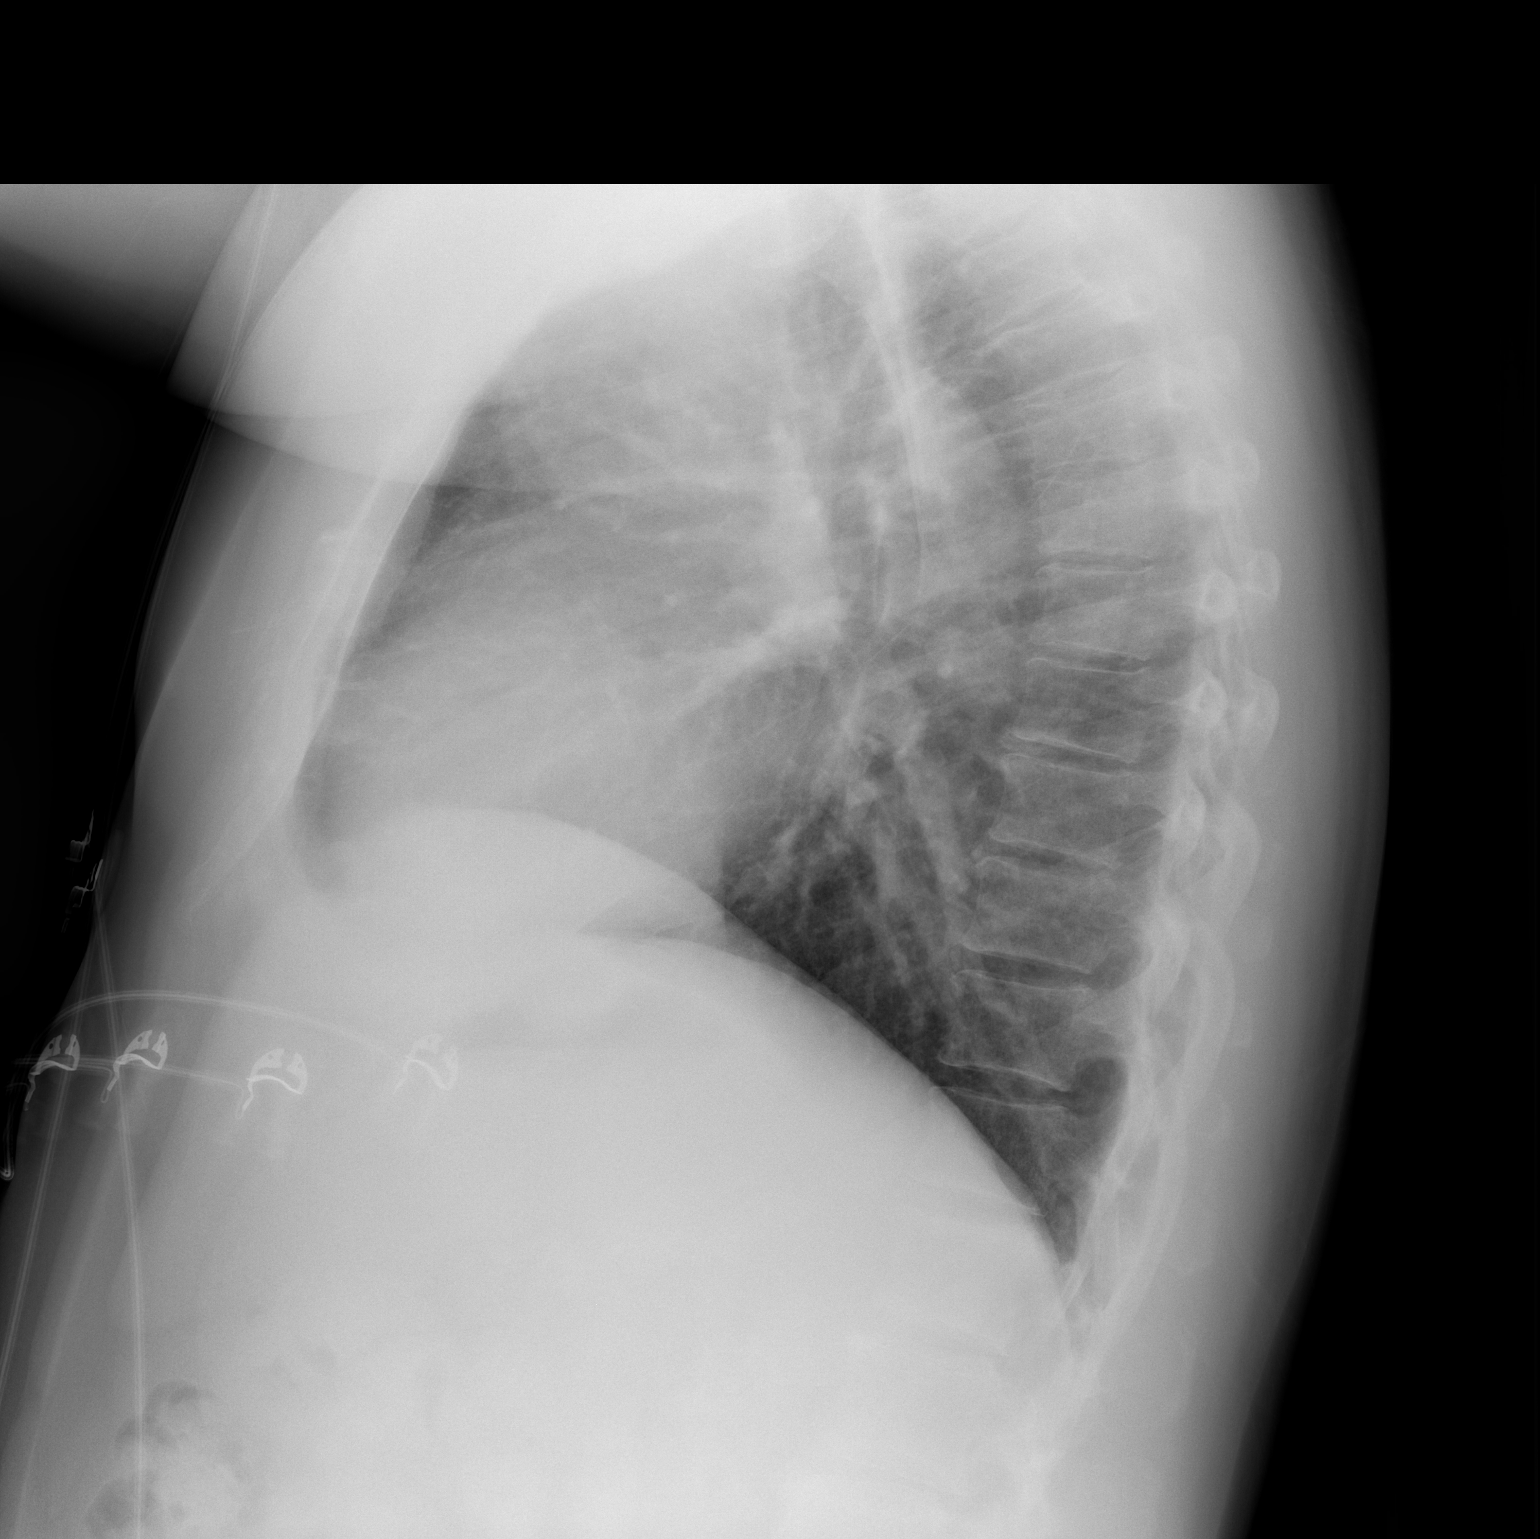

[2 of 2 positions shown; findings below may reference images not displayed]

FINDINGS: The cardiac silhouette, mediastinal and hilar contours are within
normal limits. The lungs are clear of an acute process. No pleural
effusions or pulmonary lesions. The bony thorax is intact.
IMPRESSION: No acute cardiopulmonary findings.

## 2020-06-10 ENCOUNTER — Other Ambulatory Visit: Payer: Self-pay | Admitting: Student

## 2020-12-23 DIAGNOSIS — L821 Other seborrheic keratosis: Secondary | ICD-10-CM | POA: Diagnosis not present

## 2020-12-23 DIAGNOSIS — D235 Other benign neoplasm of skin of trunk: Secondary | ICD-10-CM | POA: Diagnosis not present

## 2020-12-23 DIAGNOSIS — B360 Pityriasis versicolor: Secondary | ICD-10-CM | POA: Diagnosis not present

## 2021-09-09 DIAGNOSIS — I10 Essential (primary) hypertension: Secondary | ICD-10-CM | POA: Diagnosis not present

## 2021-09-09 DIAGNOSIS — M25562 Pain in left knee: Secondary | ICD-10-CM | POA: Diagnosis not present

## 2021-10-11 DIAGNOSIS — M179 Osteoarthritis of knee, unspecified: Secondary | ICD-10-CM | POA: Diagnosis not present

## 2021-10-11 DIAGNOSIS — Z131 Encounter for screening for diabetes mellitus: Secondary | ICD-10-CM | POA: Diagnosis not present

## 2021-10-11 DIAGNOSIS — R7309 Other abnormal glucose: Secondary | ICD-10-CM | POA: Diagnosis not present

## 2021-10-11 DIAGNOSIS — Z125 Encounter for screening for malignant neoplasm of prostate: Secondary | ICD-10-CM | POA: Diagnosis not present

## 2021-10-11 DIAGNOSIS — Z0000 Encounter for general adult medical examination without abnormal findings: Secondary | ICD-10-CM | POA: Diagnosis not present

## 2021-10-11 DIAGNOSIS — Z13220 Encounter for screening for lipoid disorders: Secondary | ICD-10-CM | POA: Diagnosis not present

## 2021-11-10 DIAGNOSIS — J40 Bronchitis, not specified as acute or chronic: Secondary | ICD-10-CM | POA: Diagnosis not present

## 2021-11-10 DIAGNOSIS — R059 Cough, unspecified: Secondary | ICD-10-CM | POA: Diagnosis not present

## 2021-11-10 DIAGNOSIS — R0981 Nasal congestion: Secondary | ICD-10-CM | POA: Diagnosis not present

## 2021-11-10 DIAGNOSIS — R509 Fever, unspecified: Secondary | ICD-10-CM | POA: Diagnosis not present

## 2021-11-10 DIAGNOSIS — Z03818 Encounter for observation for suspected exposure to other biological agents ruled out: Secondary | ICD-10-CM | POA: Diagnosis not present

## 2021-11-23 DIAGNOSIS — J069 Acute upper respiratory infection, unspecified: Secondary | ICD-10-CM | POA: Diagnosis not present

## 2022-02-18 DIAGNOSIS — J069 Acute upper respiratory infection, unspecified: Secondary | ICD-10-CM | POA: Diagnosis not present

## 2022-02-18 DIAGNOSIS — R059 Cough, unspecified: Secondary | ICD-10-CM | POA: Diagnosis not present

## 2022-02-18 DIAGNOSIS — R0981 Nasal congestion: Secondary | ICD-10-CM | POA: Diagnosis not present

## 2022-05-19 DIAGNOSIS — W57XXXA Bitten or stung by nonvenomous insect and other nonvenomous arthropods, initial encounter: Secondary | ICD-10-CM | POA: Diagnosis not present

## 2022-05-19 DIAGNOSIS — L988 Other specified disorders of the skin and subcutaneous tissue: Secondary | ICD-10-CM | POA: Diagnosis not present

## 2022-09-16 ENCOUNTER — Encounter: Payer: Self-pay | Admitting: Internal Medicine

## 2023-01-12 ENCOUNTER — Other Ambulatory Visit (INDEPENDENT_AMBULATORY_CARE_PROVIDER_SITE_OTHER): Payer: BC Managed Care – PPO

## 2023-01-12 ENCOUNTER — Ambulatory Visit (INDEPENDENT_AMBULATORY_CARE_PROVIDER_SITE_OTHER): Payer: BC Managed Care – PPO | Admitting: Internal Medicine

## 2023-01-12 ENCOUNTER — Encounter: Payer: Self-pay | Admitting: Internal Medicine

## 2023-01-12 VITALS — BP 130/80 | HR 102 | Ht 68.0 in | Wt 227.0 lb

## 2023-01-12 DIAGNOSIS — K625 Hemorrhage of anus and rectum: Secondary | ICD-10-CM | POA: Diagnosis not present

## 2023-01-12 LAB — CBC WITH DIFFERENTIAL/PLATELET
Basophils Absolute: 0 10*3/uL (ref 0.0–0.1)
Basophils Relative: 0.4 % (ref 0.0–3.0)
Eosinophils Absolute: 0.2 10*3/uL (ref 0.0–0.7)
Eosinophils Relative: 3.1 % (ref 0.0–5.0)
HCT: 41.3 % (ref 39.0–52.0)
Hemoglobin: 14.1 g/dL (ref 13.0–17.0)
Lymphocytes Relative: 21.9 % (ref 12.0–46.0)
Lymphs Abs: 1.4 10*3/uL (ref 0.7–4.0)
MCHC: 34.3 g/dL (ref 30.0–36.0)
MCV: 85.1 fl (ref 78.0–100.0)
Monocytes Absolute: 0.8 10*3/uL (ref 0.1–1.0)
Monocytes Relative: 12.6 % — ABNORMAL HIGH (ref 3.0–12.0)
Neutro Abs: 4.1 10*3/uL (ref 1.4–7.7)
Neutrophils Relative %: 62 % (ref 43.0–77.0)
Platelets: 246 10*3/uL (ref 150.0–400.0)
RBC: 4.85 Mil/uL (ref 4.22–5.81)
RDW: 13.3 % (ref 11.5–15.5)
WBC: 6.5 10*3/uL (ref 4.0–10.5)

## 2023-01-12 MED ORDER — HYDROCORTISONE (PERIANAL) 2.5 % EX CREA: 1.0000 | TOPICAL_CREAM | Freq: Two times a day (BID) | CUTANEOUS | 1 refills | Status: DC | PRN

## 2023-01-12 NOTE — Patient Instructions (Signed)
You have been scheduled for a colonoscopy. Please follow written instructions given to you at your visit today.  Please pick up your prep supplies at the pharmacy within the next 1-3 days. If you use inhalers (even only as needed), please bring them with you on the day of your procedure.  Your provider has requested that you go to the basement level for lab work before leaving today. Press "B" on the elevator. The lab is located at the first door on the left as you exit the elevator.  Due to recent changes in healthcare laws, you may see the results of your imaging and laboratory studies on MyChart before your provider has had a chance to review them.  We understand that in some cases there may be results that are confusing or concerning to you. Not all laboratory results come back in the same time frame and the provider may be waiting for multiple results in order to interpret others.  Please give [place] 48 hours in order for your provider to thoroughly review all the results before contacting the office for clarification of your results.   We have sent the following medications to your  [facility] for you to pick up at your convenience: Hydrocortisone  I appreciate the opportunity to care for you. Silvano Rusk, MD, [facility]

## 2023-01-12 NOTE — Progress Notes (Signed)
Richard Carey 62 y.o. 09-19-1961 [id number]  Assessment & Plan:   Encounter Diagnosis  Name Primary?   Rectal bleeding Yes   Probably hemorrhoidal bleeding.  Will make sure of this with colonoscopy to exclude colorectal neoplasia as a cause.  Treat with Anusol HC cream in the interim.  Lab Results  Component Value Date   WBC 6.5 01/12/2023   HGB 14.1 01/12/2023   HCT 41.3 01/12/2023   MCV 85.1 01/12/2023   PLT 246.0 01/12/2023                         hydrocortisone (ANUSOL-HC) 2.5 % rectal cream    Sig: Place 1 Application rectally 2 (two) times daily as needed for hemorrhoids or anal itching.    Dispense:  30 g    Refill:  1   The risks and benefits as well as alternatives of endoscopic procedure(s) have been discussed and reviewed. All questions answered. The patient agrees to proceed.   Subjective:   Chief Complaint: Rectal bleeding  HPI 62 year old white man status post negative screening colonoscopy in 2013, presenting with rectal bleeding that is been happening off and on for the last couple of months at least.  It was more intermittent but now he sees some blood on the toilet paper or dripping into the commode with every stool.  He denies diarrhea or constipation.  He is complaining of malodorous flatus that started in the last year or 2.  His daughter has Crohn's disease and he has some concern that he may have that disorder or something similar.  There is no diarrhea or significant abdominal pain.  GI review of systems is otherwise negative.   Allergies  Allergen Reactions   Keflex [Cephalexin] Hives   Current Meds  Medication Sig   diphenhydramine-acetaminophen (TYLENOL PM) 25-500 MG TABS tablet Take 1 tablet by mouth at bedtime as needed.   [DISCONTINUED] hydrocortisone (ANUSOL-HC) 2.5 % rectal cream Place 1 Application rectally 2 (two) times daily as needed for hemorrhoids or anal itching.   Past Medical History:  Diagnosis Date   Acute medial  meniscus tear of right knee    Ankle pain, right    Past Surgical History:  Procedure Laterality Date   COLONOSCOPY     KNEE ARTHROSCOPY Right 09/09/2015   Procedure: RIGHT ARTHROSCOPY KNEE;  Surgeon: Frederik Pear, MD;  Location: [place];  Service: Orthopedics;  Laterality: Right;   LYMPH NODE BIOPSY     left side neck   MOUTH SURGERY     NASAL SINUS SURGERY     sinus scraped /1991   TONSILLECTOMY     TOOTH EXTRACTION     Social History   Social History Narrative   Patient is married he has 1 daughter (she has Crohn's disease).  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   No alcohol never smoker drinks many cups of tea each day.   family history includes Aneurysm in his paternal grandfather.   Review of Systems Negative otherwise as per HPI.  Objective:   Physical Exam @BP$  130/80   Pulse (!) 102   Ht 5' 8"$  (1.727 m)   Wt 227 lb (103 kg)   BMI 34.52 kg/m @  General:  Well-developed, well-nourished and in no acute distress Eyes:  anicteric. Lungs: Clear to auscultation bilaterally. Heart:   S1S2, no rubs, murmurs, gallops. Abdomen:  soft, non-tender, no hepatosplenomegaly, hernia, or mass  and BS+.  Rectal: Probabl hemorrhoids in anal canal, no mass brown stool, nontender  Neuro:  A&O x 3.  Psych:  appropriate mood and  Affect.   Data Reviewed: See HPI

## 2023-02-07 ENCOUNTER — Encounter: Payer: Self-pay | Admitting: Internal Medicine

## 2023-02-14 ENCOUNTER — Encounter: Payer: Self-pay | Admitting: Internal Medicine

## 2023-02-14 ENCOUNTER — Ambulatory Visit (AMBULATORY_SURGERY_CENTER): Payer: BC Managed Care – PPO | Admitting: Internal Medicine

## 2023-02-14 VITALS — BP 117/73 | HR 70 | Temp 97.1°F | Resp 10 | Ht 68.0 in | Wt 227.0 lb

## 2023-02-14 DIAGNOSIS — K644 Residual hemorrhoidal skin tags: Secondary | ICD-10-CM

## 2023-02-14 DIAGNOSIS — K529 Noninfective gastroenteritis and colitis, unspecified: Secondary | ICD-10-CM

## 2023-02-14 DIAGNOSIS — K625 Hemorrhage of anus and rectum: Secondary | ICD-10-CM

## 2023-02-14 MED ORDER — SODIUM CHLORIDE 0.9 % IV SOLN: 500.0000 mL | Freq: Once | INTRAVENOUS | Status: DC

## 2023-02-14 NOTE — Op Note (Signed)
[facility] Patient Name: Richard Carey Procedure Date: 02/14/2023 1:21 PM MRN: [id number] Endoscopist: Gatha Mayer , MD, [id number] Age: 62 Referring MD:  Date of Birth: August 03, 1961 Gender: Male Account #: [id number] Procedure:                Colonoscopy Indications:              Rectal bleeding Medicines:                Monitored Anesthesia Care Procedure:                Pre-Anesthesia Assessment:                           - Prior to the procedure, a History and Physical                            was performed, and patient medications and                            allergies were reviewed. The patient's tolerance of                            previous anesthesia was also reviewed. The risks                            and benefits of the procedure and the sedation                            options and risks were discussed with the patient.                            All questions were answered, and informed consent                            was obtained. Prior Anticoagulants: The patient has                            taken no anticoagulant or antiplatelet agents. ASA                            Grade Assessment: I - A normal, healthy patient.                            After reviewing the risks and benefits, the patient                            was deemed in satisfactory condition to undergo the                            procedure.                           After obtaining informed consent, the colonoscope  was passed under direct vision. Throughout the                            procedure, the patient's blood pressure, pulse, and                            oxygen saturations were monitored continuously. The                            Olympus CF-HQ190L ([id number]) Colonoscope was                            introduced through the anus and advanced to the the                            terminal ileum, with identification of the                             appendiceal orifice and IC valve. The colonoscopy                            was performed without difficulty. The patient                            tolerated the procedure well. The quality of the                            bowel preparation was good. The terminal ileum,                            ileocecal valve, appendiceal orifice, and rectum                            were photographed. The bowel preparation used was                            Miralax via split dose instruction. Scope In: 1:36:14 PM Scope Out: 1:51:34 PM Scope Withdrawal Time: 0 hours 12 minutes 20 seconds  Total Procedure Duration: 0 hours 15 minutes 20 seconds  Findings:                 The perianal and digital rectal examinations were                            normal. Pertinent negatives include normal prostate                            (size, shape, and consistency).                           A diffuse area of severely altered vascular,                            congested, hemorrhagic and inflamed mucosa was  found in the sigmoid colon. Biopsies were taken                            with a cold forceps for histology. Verification of                            patient identification for the specimen was done.                            Estimated blood loss was minimal.                           Multiple diverticula were found in the sigmoid                            colon.                           External and internal hemorrhoids were found.                           The terminal ileum appeared normal.                           The exam was otherwise without abnormality on                            direct and retroflexion views.                           Biopsies for histology were taken with a cold                            forceps from the ascending colon, transverse colon                            and descending colon for evaluation of microscopic                             colitis. Complications:            No immediate complications. Estimated Blood Loss:     Estimated blood loss was minimal. Impression:               - Altered vascular, congested, hemorrhagic and                            inflamed mucosa in the sigmoid colon. Biopsied.                            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                           - External and internal hemorrhoids.                           - The examined portion of the ileum was normal.                           - The examination was otherwise normal on direct                            and retroflexion views.                           - Biopsies were taken with a cold forceps from the                            ascending colon, transverse colon and descending                            colon for evaluation of microscopic colitis. Recommendation:           - Patient has a contact number available for                            emergencies. The signs and symptoms of potential                            delayed complications were discussed with the                            patient. Return to normal activities tomorrow.                            Written discharge instructions were provided to the                            patient.                           - Resume previous diet.                           - Continue present medications.                           - Await pathology results.                           - Repeat colonoscopy is recommended. The                            colonoscopy date will be determined after pathology  results from today's exam become available for                            review. Gatha Mayer, MD 02/14/2023 2:06:01 PM This report has been signed electronically.

## 2023-02-14 NOTE — Patient Instructions (Addendum)
Though you do have hemorrhoids I found an inflammatory condition in the sigmoid colon (colitis).  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  I took biopsies and will let you know next steps after results are in.  I appreciate the opportunity to care for you. Gatha Mayer, MD, FACG   YOU HAD AN ENDOSCOPIC PROCEDURE TODAY AT [facility]:   Refer to the procedure report that was given to you for any specific questions about what was found during the examination.  If the procedure report does not answer your questions, please call your gastroenterologist to clarify.  If you requested that your care partner not be given the details of your procedure findings, then the procedure report has been included in a sealed envelope for you to review at your convenience later.  YOU SHOULD EXPECT: Some feelings of bloating in the abdomen. Passage of more gas than usual.  Walking can help get rid of the air that was put into your GI tract during the procedure and reduce the bloating. If you had a lower endoscopy (such as a colonoscopy or flexible sigmoidoscopy) you may notice spotting of blood in your stool or on the toilet paper. If you underwent a bowel prep for your procedure, you may not have a normal bowel movement for a few days.  Please Note:  You might notice some irritation and congestion in your nose or some drainage.  This is from the oxygen used during your procedure.  There is no need for concern and it should clear up in a day or so.  SYMPTOMS TO REPORT IMMEDIATELY:  Following lower endoscopy (colonoscopy or flexible sigmoidoscopy):  Excessive amounts of blood in the stool  Significant tenderness or worsening of abdominal pains  Swelling of the abdomen that is new, acute  Fever of 100F or higher   For urgent or emergent issues, a gastroenterologist can be reached at any hour by calling [phone]. Do not use MyChart  messaging for urgent concerns.    DIET:  We do recommend a small meal at first, but then you may proceed to your regular diet.  Drink plenty of fluids but you should avoid alcoholic beverages for 24 hours.  ACTIVITY:  You should plan to take it easy for the rest of today and you should NOT DRIVE or use heavy machinery until tomorrow (because of the sedation medicines used during the test).    FOLLOW UP: Our staff will call the number listed on your records the next business day following your procedure.  We will call around 7:15- 8:00 am to check on you and address any questions or concerns that you may have regarding the information given to you following your procedure. If we do not reach you, we will leave a message.     If any biopsies were taken you will be contacted by phone or by letter within the next 1-3 weeks.  Please call us at [phone] if you have not heard about the biopsies in 3 weeks.    SIGNATURES/CONFIDENTIALITY: You and/or your care partner have signed paperwork which will be entered into your electronic medical record.  These signatures attest to the fact that that the information above on your After Visit Summary has been reviewed and is understood.  Full responsibility of the confidentiality of this discharge information lies with you and/or your care-partner.

## 2023-02-14 NOTE — Progress Notes (Signed)
Sedate, gd SR, tolerated procedure well, VSS, report to RN 

## 2023-02-14 NOTE — Progress Notes (Signed)
Pt's states no medical or surgical changes since previsit or office visit. VS assessed by D.T 

## 2023-02-14 NOTE — Progress Notes (Signed)
Called to room to assist during endoscopic procedure.  Patient ID and intended procedure confirmed with present staff. Received instructions for my participation in the procedure from the performing physician.  

## 2023-02-14 NOTE — Progress Notes (Signed)
[facility] History and Physical   Primary Care Physician:  Orpah Melter, MD   Reason for Procedure:   Rectal bleeding  Plan:    colonoscopy     HPI: Richard Carey is a 62 y.o. male status post negative screening colonoscopy in 2013, presenting with rectal bleeding that is been happening off and on for the last couple of months at least. It was more intermittent but now he sees some blood on the toilet paper or dripping into the commode with every stool. He denies diarrhea or constipation. He is complaining of malodorous flatus that started in the last year or 2. His daughter has Crohn's disease and he has some concern that he may have that disorder or something similar. There is no diarrhea or significant abdominal pain. GI review of systems is otherwise negative.    Past Medical History:  Diagnosis Date   Acute medial meniscus tear of right knee    Allergy    Ankle pain, right     Past Surgical History:  Procedure Laterality Date   COLONOSCOPY     KNEE ARTHROSCOPY Right 09/09/2015   Procedure: RIGHT ARTHROSCOPY KNEE;  Surgeon: Frederik Pear, MD;  Location: [place];  Service: Orthopedics;  Laterality: Right;   LYMPH NODE BIOPSY     left side neck   MOUTH SURGERY     NASAL SINUS SURGERY     sinus scraped /1991   TONSILLECTOMY     TOOTH EXTRACTION      Prior to Admission medications   Medication Sig Start Date End Date Taking? Authorizing Provider  diphenhydramine-acetaminophen (TYLENOL PM) 25-500 MG TABS tablet Take 1 tablet by mouth at bedtime as needed.   Yes [provider]    Current Outpatient Medications  Medication Sig Dispense Refill   diphenhydramine-acetaminophen (TYLENOL PM) 25-500 MG TABS tablet Take 1 tablet by mouth at bedtime as needed.     Current Facility-Administered Medications  Medication Dose Route Frequency Provider Last Rate Last Admin   0.9 %  sodium chloride infusion  500 mL Intravenous Once Gatha Mayer, MD        Allergies as of 02/14/2023 - Review Complete 02/14/2023  Allergen Reaction Noted   Keflex [cephalexin] Hives 02/15/2014    Family History  Problem Relation Age of Onset   Aneurysm Paternal Grandfather    Colon cancer Neg Hx    Rectal cancer Neg Hx    Stomach cancer Neg Hx    Esophageal cancer Neg Hx     Social History   Socioeconomic History   Marital status: Married    Spouse name: Not on file   Number of children: Not on file   Years of education: Not on file   Highest education level: Not on file  Occupational History   Not on file  Tobacco Use   Smoking status: Never   Smokeless tobacco: Never  Vaping Use   Vaping Use: Never used  Substance and Sexual Activity   Alcohol use: Yes    Comment: social   Drug use: No   Sexual activity: Not on file  Other Topics Concern   Not on file  Social History Narrative   Patient is married he has 1 daughter (she has Crohn's disease).  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   No alcohol never smoker drinks many cups of tea each day.   Social Determinants of Health   Financial Resource Strain: Not  on file  Food Insecurity: Not on file  Transportation Needs: Not on file  Physical Activity: Not on file  Stress: Not on file  Social Connections: Not on file  Intimate Partner Violence: Not on file    Review of Systems:  All other review of systems negative except as mentioned in the HPI.  Physical Exam: Vital signs BP 136/79   Pulse 82   Temp (!) 97.1 F (36.2 C) (Skin)   Ht 5\' 8"  (1.727 m)   Wt 227 lb (103 kg)   SpO2 96%   BMI 34.52 kg/m   General:   Alert,  Well-developed, well-nourished, pleasant and cooperative in NAD Lungs:  Clear throughout to auscultation.   Heart:  Regular rate and rhythm; no murmurs, clicks, rubs,  or gallops. Abdomen:  Soft, nontender and nondistended. Normal bowel sounds.   Neuro/Psych:  Alert and cooperative. Normal mood and affect. A  and O x 3   @Richard Carey  Simonne Maffucci, MD, [facility] [phone] (pager) 02/14/2023 1:28 PM@

## 2023-02-15 ENCOUNTER — Telehealth: Payer: Self-pay | Admitting: *Deleted

## 2023-02-15 NOTE — Telephone Encounter (Signed)
Follow up call attempt.  LVM to call if any questions or concerns. 

## 2023-02-27 ENCOUNTER — Other Ambulatory Visit: Payer: Self-pay

## 2023-02-27 DIAGNOSIS — K529 Noninfective gastroenteritis and colitis, unspecified: Secondary | ICD-10-CM

## 2023-02-27 MED ORDER — METRONIDAZOLE 500 MG PO TABS: 500.0000 mg | ORAL_TABLET | Freq: Two times a day (BID) | ORAL | 0 refills | Status: AC

## 2023-02-27 MED ORDER — CIPROFLOXACIN HCL 500 MG PO TABS: 500.0000 mg | ORAL_TABLET | Freq: Two times a day (BID) | ORAL | 0 refills | Status: AC

## 2023-04-04 ENCOUNTER — Ambulatory Visit (INDEPENDENT_AMBULATORY_CARE_PROVIDER_SITE_OTHER): Payer: BC Managed Care – PPO | Admitting: Internal Medicine

## 2023-04-04 ENCOUNTER — Encounter: Payer: Self-pay | Admitting: Internal Medicine

## 2023-04-04 VITALS — BP 140/80 | HR 90 | Ht 68.0 in | Wt 230.8 lb

## 2023-04-04 DIAGNOSIS — K5730 Diverticulosis of large intestine without perforation or abscess without bleeding: Secondary | ICD-10-CM

## 2023-04-04 DIAGNOSIS — K648 Other hemorrhoids: Secondary | ICD-10-CM | POA: Insufficient documentation

## 2023-04-04 DIAGNOSIS — K5010 Crohn's disease of large intestine without complications: Secondary | ICD-10-CM | POA: Diagnosis not present

## 2023-04-04 DIAGNOSIS — K5130 Ulcerative (chronic) rectosigmoiditis without complications: Secondary | ICD-10-CM | POA: Insufficient documentation

## 2023-04-04 MED ORDER — HYDROCORTISONE ACETATE 25 MG RE SUPP: 25.0000 mg | Freq: Every day | RECTAL | 1 refills | Status: DC

## 2023-04-04 NOTE — Progress Notes (Signed)
Richard Carey 62 y.o. 1961-03-06 [id number]  Assessment & Plan:   Encounter Diagnoses  Name Primary?   Bleeding internal hemorrhoids Yes   Segmental colitis associated with diverticulosis (HCC)     He is definitely bleeding from hemorrhoids, question if his SCAD is contributing.  Treat hemorrhoids with hydrocortisone suppositories nightly x 12 nights.  He is to message me by MyChart with an update.  Hard to tell if the Cipro and metronidazole made a difference for him, consider budesonide versus prednisone course depending upon clinical course.  He may need a repeat endoscopic evaluation to determine status of colitis.  We did discuss scheduling a follow-up visit but he travels for work a lot so we decided to try the [employer] route.    Subjective:   Chief Complaint: Rectal bleeding  HPI 62 year old white man who was seen for rectal bleeding in February and underwent colonoscopy in March.  I thought he had bleeding hemorrhoids and he did, he bled for several days after February 2024 exam in the office.  At colonoscopy on March 26 he had segmental colitis associated with diverticulosis found in addition to his hemorrhoids.  He is now having some intermittent rectal bleeding still.  Can be with wiping or there can be a pool in the commode.  It is unpredictable.  Bowel habits did not really change a whole lot with the antibiotics that when he was on them they were soft all the time now they go mushy to solid though tend to be mushy and sometimes they break up into pieces.  He has not had any melena or clear-cut hematochezia i.e. blood in the stool.  Colonoscopy 02/14/2023 Impression:               - Altered vascular, congested, hemorrhagic and                            inflamed mucosa in the sigmoid colon. Biopsied.                            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                           - External and internal hemorrhoids.                           - The examined portion of the ileum was normal.                           - The examination was otherwise normal on direct                            and retroflexion views.                           - Biopsies were taken  with a cold forceps from the                            ascending colon, transverse colon and descending                            colon for evaluation of microscopic colitis.   1. Surgical [P], colon, transverse and ascending (normal appearing colon) - BENIGN COLONIC MUCOSA WITHOUT DIAGNOSTIC ABNORMALITY - NEGATIVE FOR SIGNIFICANT ACUTE INFLAMMATION, CHRONIC CHANGES, GRANULOMATA, DYSPLASIA OR MALIGNANCY 2. Surgical [P], colon, sigmoid - CHRONIC ACTIVE COLITIS - NEGATIVE FOR GRANULOMATA, DYSPLASIA OR MALIGNANCY - SEE NOTE  Allergies  Allergen Reactions   Keflex [Cephalexin] Hives   Current Meds  Medication Sig   diphenhydramine-acetaminophen (TYLENOL PM) 25-500 MG TABS tablet Take 1 tablet by mouth at bedtime as needed.   hydrocortisone (ANUSOL-HC) 25 MG suppository Place 1 suppository (25 mg total) rectally at bedtime.   Past Medical History:  Diagnosis Date   Acute medial meniscus tear of right knee    Allergy    Ankle pain, right    Colitis    Past Surgical History:  Procedure Laterality Date   COLONOSCOPY     KNEE ARTHROSCOPY Right 09/09/2015   Procedure: RIGHT ARTHROSCOPY KNEE;  Surgeon: Gean Birchwood, MD;  Location: [facility];  Service: Orthopedics;  Laterality: Right;   LYMPH NODE BIOPSY     left side neck   MOUTH SURGERY     NASAL SINUS SURGERY     sinus scraped /1991   TONSILLECTOMY     TOOTH EXTRACTION     Social History   Social History Narrative   Patient is married he has 1 daughter (she has Crohn's disease).  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   No alcohol  never smoker drinks many cups of tea each day.   family history includes Aneurysm in his paternal grandfather.   Review of Systems As per HPI  Objective:   Physical Exam BP (!) 158/100   Pulse 90   Ht 5\' 8"  (1.727 m)   Wt 230 lb 12.8 oz (104.7 kg)   BMI 35.09 kg/m  NAD  Rectal - NL anodern ++ spasm, no mass, palpable hemorrhoids  Anoscopy - inflamed internal hemorrhoids - w/ traumatic bleeding RP and signs of prior bleeding there also

## 2023-04-04 NOTE — Patient Instructions (Signed)
Try the hydrocortisone suppositories - use for 12 nights straight. - Good Rx coupon should make them affordable, as we discussed. Pharmacy should help w/ this.  Once you have done this - send me an update by My Cart re: bleeding and bowel symptoms.  I appreciate the opportunity to care for you. Iva Boop, MD, [facility]

## 2023-06-19 ENCOUNTER — Telehealth: Payer: Self-pay | Admitting: Internal Medicine

## 2023-06-19 NOTE — Telephone Encounter (Signed)
Inbound call from patient states he is experiencing rectal bleeding a would like a f/u call from a nurse. Please advise.   Thank you

## 2023-06-20 NOTE — Telephone Encounter (Signed)
Dr. Leone Payor Pt.  Pt stated that he has had ongoing rectal bleeding that has increased over the last week. Recent office visit with Dr. Leone Payor on 04-04-2023. Recent Colonoscopy performed 02/14/2023 Pt stated that he has had ongoing rectal bleeding but has increased over the last week. Pt reports BRB mixed in with stool and separate as well. Pt stated that the first BM in the morning is more blood than  the rest of the BM's throughout the day. Pt states that his BM's  are more softer than normal  and averaging 3-5 BM's per day. No pain, no nausea, no temp. Please advise as DOD

## 2023-06-20 NOTE — Telephone Encounter (Signed)
Please check if increasing stool frequency is new, if he is having diarrhea check stat stool C. difficile if not advise patient to add soluble fiber, Benefiber 1 teaspoon 3 times daily with meals.  Send Rx for prednisone 20 mg daily x 7 days and then decrease by 5 mg every 7 days until he completes the course.  Please schedule next available office appointment with Dr. Leone Payor or APP

## 2023-06-21 ENCOUNTER — Other Ambulatory Visit: Payer: Self-pay

## 2023-06-21 DIAGNOSIS — K625 Hemorrhage of anus and rectum: Secondary | ICD-10-CM

## 2023-06-21 MED ORDER — PREDNISONE 5 MG PO TABS: ORAL_TABLET | ORAL | 0 refills | Status: AC

## 2023-06-21 NOTE — Telephone Encounter (Signed)
Pt made aware of Dr. Lavon Paganini recommendations: Pt stated that frequency has increased, although  not having diarrhea. Pt was advised  to add soluble fiber, Benefiber 1 teaspoon 3 times daily with meals. Prescription for prednisone taper was sent to pharmacy. Pt was made aware of the taper. Pt needs a (7 day hold office visit) Pt stated that he was available on 06/30/2023 from 0830-1130 AM  Pt was notified that we will try and book this appointment on 06/23/2023 and make him aware. Pt verbalized understanding with all questions answered.

## 2023-06-23 NOTE — Telephone Encounter (Signed)
Scheduled OV with patient for 06/30/23 at 11:00 am with Victorino Dike, [place].

## 2023-06-30 ENCOUNTER — Encounter: Payer: Self-pay | Admitting: Physician Assistant

## 2023-06-30 ENCOUNTER — Other Ambulatory Visit (INDEPENDENT_AMBULATORY_CARE_PROVIDER_SITE_OTHER): Payer: BC Managed Care – PPO

## 2023-06-30 ENCOUNTER — Ambulatory Visit: Payer: BC Managed Care – PPO | Admitting: Physician Assistant

## 2023-06-30 ENCOUNTER — Other Ambulatory Visit: Payer: Self-pay

## 2023-06-30 VITALS — BP 140/90 | HR 80 | Ht 68.0 in | Wt 225.0 lb

## 2023-06-30 DIAGNOSIS — K625 Hemorrhage of anus and rectum: Secondary | ICD-10-CM

## 2023-06-30 DIAGNOSIS — R194 Change in bowel habit: Secondary | ICD-10-CM | POA: Diagnosis not present

## 2023-06-30 DIAGNOSIS — K529 Noninfective gastroenteritis and colitis, unspecified: Secondary | ICD-10-CM

## 2023-06-30 DIAGNOSIS — K921 Melena: Secondary | ICD-10-CM

## 2023-06-30 LAB — COMPREHENSIVE METABOLIC PANEL
ALT: 23 U/L (ref 0–53)
AST: 15 U/L (ref 0–37)
Albumin: 4.5 g/dL (ref 3.5–5.2)
Alkaline Phosphatase: 66 U/L (ref 39–117)
BUN: 21 mg/dL (ref 6–23)
CO2: 29 mEq/L (ref 19–32)
Calcium: 9.7 mg/dL (ref 8.4–10.5)
Chloride: 100 mEq/L (ref 96–112)
Creatinine, Ser: 1.17 mg/dL (ref 0.40–1.50)
GFR: 66.52 mL/min (ref >60.00)
Glucose, Bld: 118 mg/dL — ABNORMAL HIGH (ref 70–99)
Potassium: 3.9 mEq/L (ref 3.5–5.1)
Sodium: 136 mEq/L (ref 135–145)
Total Bilirubin: 0.5 mg/dL (ref 0.2–1.2)
Total Protein: 7.3 g/dL (ref 6.0–8.3)

## 2023-06-30 LAB — CBC WITH DIFFERENTIAL/PLATELET
Basophils Absolute: 0 10*3/uL (ref 0.0–0.1)
Basophils Relative: 0.2 % (ref 0.0–3.0)
Eosinophils Absolute: 0.2 10*3/uL (ref 0.0–0.7)
Eosinophils Relative: 2.1 % (ref 0.0–5.0)
HCT: 41.2 % (ref 39.0–52.0)
Hemoglobin: 13.5 g/dL (ref 13.0–17.0)
Lymphocytes Relative: 22.2 % (ref 12.0–46.0)
Lymphs Abs: 1.8 10*3/uL (ref 0.7–4.0)
MCHC: 32.8 g/dL (ref 30.0–36.0)
MCV: 83.8 fl (ref 78.0–100.0)
Monocytes Absolute: 1 10*3/uL (ref 0.1–1.0)
Monocytes Relative: 12.7 % — ABNORMAL HIGH (ref 3.0–12.0)
Neutro Abs: 5 10*3/uL (ref 1.4–7.7)
Neutrophils Relative %: 62.8 % (ref 43.0–77.0)
Platelets: 284 10*3/uL (ref 150.0–400.0)
RBC: 4.92 Mil/uL (ref 4.22–5.81)
RDW: 13.9 % (ref 11.5–15.5)
WBC: 7.9 10*3/uL (ref 4.0–10.5)

## 2023-06-30 NOTE — Addendum Note (Signed)
Addended by: Missy Sabins on: 06/30/2023 11:54 AM   Modules accepted: Orders

## 2023-06-30 NOTE — Progress Notes (Signed)
Chief Complaint: Rectal bleeding  HPI:    [name] is a 62 year old Caucasian male with a past medical history as listed below, known to Dr. Leone Carey, who was referred to me by Richard Rua, MD for a complaint of rectal bleeding.      02/14/2023 colonoscopy with segmental colitis associated with diverticulosis found in addition to hemorrhoids.    04/04/2023 patient seen in clinic by Dr. Leone Carey and discussed that he definitely had bleeding from hemorrhoids with question of scad contributing.  At that time recommended treatment of hemorrhoids with Hydrocortisone suppositories nightly x 12 nights.  At that point hard to tell if Cipro and Metronidazole made a difference for him, discussed possible Budesonide versus Prednisone depending clinical course.  Also discussed possible repeat endoscopic evaluation to determine status of colitis.    06/19/2023 patient called in and described rectal bleeding.  At that time recommended stool studies if having diarrhea.  Add Benefiber 1 teaspoon 3 times daily.  Also sent prescription for Prednisone 20 mg daily x 7 days, then decrease by 5 mg every 7 days until he completed the course.  Patient reported he was not having diarrhea.  Told to use the fiber and Prednisone.    Today, patient describes that none of his symptoms really changed since seeing Dr. Leone Carey back in May.  He continues with bowel movements which seem to be even more frequent and more explosive.  Tells me they are small there is a lot of urgency and a lot of gas when he does have them, now 5-6 times a day, sometimes solid and other times just particle matter, he does continue to have bright red blood occasionally, this is unpredictable, although typically first thing in the morning he will just pass bright red blood and then after that may not see any more may see it 1 other time.  Describes that his daughter is 75 years old and has a diagnosis of Crohn's and thinks he may have C. difficile.  He tells me  that even after the suppositories there is no real change and currently he is on Prednisone 20 mg daily which she has not tapered yet and has not noticed a real change either.  He did have 1 solid looking stool in this timeframe over the past week of using prednisone but nothing to really write home about.  Concerned in regards to ongoing symptoms and tells me he travels for work and with the frequency of stooling and urgency it would be hard for him to do that.    Denies fever, chills or weight loss.       Past Medical History:  Diagnosis Date   Acute medial meniscus tear of right knee    Allergy    Ankle pain, right    Colitis     Past Surgical History:  Procedure Laterality Date   COLONOSCOPY     KNEE ARTHROSCOPY Right 09/09/2015   Procedure: RIGHT ARTHROSCOPY KNEE;  Surgeon: Gean Birchwood, MD;  Location: [facility];  Service: Orthopedics;  Laterality: Right;   LYMPH NODE BIOPSY     left side neck   MOUTH SURGERY     NASAL SINUS SURGERY     sinus scraped /1991   TONSILLECTOMY     TOOTH EXTRACTION      Current Outpatient Medications  Medication Sig Dispense Refill   diphenhydramine-acetaminophen (TYLENOL PM) 25-500 MG TABS tablet Take 1 tablet by mouth at bedtime as needed.     predniSONE (  DELTASONE) 5 MG tablet Take 4 tablets (20 mg total) by mouth daily for 7 days, THEN 3 tablets (15 mg total) daily for 7 days, THEN 2 tablets (10 mg total) daily for 7 days, THEN 1 tablet (5 mg total) daily for 7 days. 70 tablet 0   No current facility-administered medications for this visit.    Allergies as of 06/30/2023 - Review Complete 06/30/2023  Allergen Reaction Noted   Keflex [cephalexin] Hives 02/15/2014    Family History  Problem Relation Age of Onset   Aneurysm Paternal Grandfather    Colon cancer Neg Hx    Rectal cancer Neg Hx    Stomach cancer Neg Hx    Esophageal cancer Neg Hx     Social History   Socioeconomic History   Marital status: Married     Spouse name: Not on file   Number of children: Not on file   Years of education: Not on file   Highest education level: Not on file  Occupational History   Not on file  Tobacco Use   Smoking status: Never   Smokeless tobacco: Never  Vaping Use   Vaping status: Never Used  Substance and Sexual Activity   Alcohol use: Yes    Comment: social   Drug use: No   Sexual activity: Not on file  Other Topics Concern   Not on file  Social History Narrative   Patient is married he has 1 daughter (she has Crohn's disease).  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   No alcohol never smoker drinks many cups of tea each day.   Social Determinants of Health   Financial Resource Strain: Not on file  Food Insecurity: Not on file  Transportation Needs: Not on file  Physical Activity: Not on file  Stress: Not on file  Social Connections: Unknown (03/31/2022)   [facility]   Social Network    Social Network: Not on file  Intimate Partner Violence: Unknown (02/21/2022)   Received from [facility]   HITS    Physically Hurt: Not on file    Insult or Talk Down To: Not on file    Threaten Physical Harm: Not on file    Scream or Curse: Not on file    Review of Systems:    Constitutional: No weight loss, fever or chills Cardiovascular: No chest pain Respiratory: No SOB  Gastrointestinal: See HPI and otherwise negative   Physical Exam:  Vital signs: BP (!) 140/90   Pulse 80   Ht 5\' 8"  (1.727 m)   Wt 225 lb (102.1 kg)   BMI 34.21 kg/m    Constitutional:   Pleasant Caucasian male appears to be in NAD, Well developed, Well nourished, alert and cooperative Respiratory: Respirations even and unlabored. Lungs clear to auscultation bilaterally.   No wheezes, crackles, or rhonchi.  Cardiovascular: Normal S1, S2. No MRG. Regular rate and rhythm. No peripheral edema, cyanosis or pallor.  Gastrointestinal:  Soft, nondistended, nontender. No rebound or  guarding. Normal bowel sounds. No appreciable masses or hepatomegaly. Rectal:  Not performed.  Psychiatric: Demonstrates good judgement and reason without abnormal affect or behaviors.  RELEVANT LABS AND IMAGING: CBC    Component Value Date/Time   WBC 6.5 01/12/2023 1527   RBC 4.85 01/12/2023 1527   HGB 14.1 01/12/2023 1527   HCT 41.3 01/12/2023 1527   PLT 246.0 01/12/2023 1527   MCV 85.1 01/12/2023 1527   MCH 28.4 10/27/2019 1305  MCHC 34.3 01/12/2023 1527   RDW 13.3 01/12/2023 1527   LYMPHSABS 1.4 01/12/2023 1527   MONOABS 0.8 01/12/2023 1527   EOSABS 0.2 01/12/2023 1527   BASOSABS 0.0 01/12/2023 1527    CMP     Component Value Date/Time   NA 140 10/27/2019 1305   K 4.0 10/27/2019 1305   CL 104 10/27/2019 1305   CO2 29 10/27/2019 1305   GLUCOSE 136 (H) 10/27/2019 1305   BUN 21 (H) 10/27/2019 1305   CREATININE 1.02 10/27/2019 1305   CALCIUM 9.7 10/27/2019 1305   GFRNONAA >60 10/27/2019 1305   GFRAA >60 10/27/2019 1305    Assessment: 1.  Hematochezia: Continues per patient even since May, has tried steroid suppositories for hemorrhoids as well as some Prednisone 20 mg daily for the past week which is really not affected the symptoms, colonoscopy in March with question of segmental colitis; consider underlying IBD versus other 2.  Change in bowel habits: With above, now more explosive and more frequent  Plan: 1.  At this point recommend repeat colonoscopy for further biopsies.  This was discussed by Dr. Leone Carey at last visit if symptoms continued.  He was scheduled with Dr. Leone Carey in August.  Did provide the patient in detail list of risks for the procedure and he agrees to proceed. Patient is appropriate for endoscopic procedure(s) in the ambulatory ([facility]) setting.  2.  At this point it does not seem like Prednisone is helping that much, I would like him to taper off, he will drop down to 10 mg for the next 3 days and then 5 mg for 3 days and then stop.  We will schedule  colonoscopy at least a week or 2 out from that to allow [place] to see what his colon looks like without interference from the steroid. 3.  Ordered C. difficile PCR per patient request.  Also ordered labs including a CBC and CMP. 4.  Patient to follow in clinic per recommendations after labs and procedure above.  Hyacinth Meeker, PA-C [facility] 06/30/2023, 10:56 AM  Cc: Richard Rua, MD

## 2023-06-30 NOTE — Patient Instructions (Addendum)
Your provider has requested that you go to the basement level for lab work before leaving today. Press "B" on the elevator. The lab is located at the first door on the left as you exit the elevator  Taper off Prednisone as follows:   2 tablets (10mg ) for 3 days then, 1 tablet (5mg ) for 3 days then stop  You have been scheduled for a colonoscopy. Please follow written instructions given to you at your visit today.   Please pick up your prep supplies at the pharmacy within the next 1-3 days.  If you use inhalers (even only as needed), please bring them with you on the day of your procedure.  DO NOT TAKE 7 DAYS PRIOR TO TEST- Trulicity (dulaglutide) Ozempic, Wegovy (semaglutide) Mounjaro (tirzepatide) Bydureon Bcise (exanatide extended release)  DO NOT TAKE 1 DAY PRIOR TO YOUR TEST Rybelsus (semaglutide) Adlyxin (lixisenatide) Victoza (liraglutide) Byetta (exanatide) ___________________________________________________________________________   Due to recent changes in healthcare laws, you may see the results of your imaging and laboratory studies on MyChart before your provider has had a chance to review them.  We understand that in some cases there may be results that are confusing or concerning to you. Not all laboratory results come back in the same time frame and the provider may be waiting for multiple results in order to interpret others.  Please give [place] 48 hours in order for your provider to thoroughly review all the results before contacting the office for clarification of your results.

## 2023-06-30 NOTE — Addendum Note (Signed)
Addended by: Christ Kick on: 06/30/2023 11:40 AM   Modules accepted: Orders

## 2023-08-05 ENCOUNTER — Encounter: Payer: Self-pay | Admitting: Certified Registered Nurse Anesthetist

## 2023-08-08 ENCOUNTER — Encounter: Payer: Self-pay | Admitting: Internal Medicine

## 2023-08-08 ENCOUNTER — Ambulatory Visit (AMBULATORY_SURGERY_CENTER): Payer: BC Managed Care – PPO | Admitting: Internal Medicine

## 2023-08-08 VITALS — BP 136/79 | HR 66 | Temp 98.0°F | Resp 17 | Ht 68.0 in | Wt 225.0 lb

## 2023-08-08 DIAGNOSIS — K625 Hemorrhage of anus and rectum: Secondary | ICD-10-CM

## 2023-08-08 DIAGNOSIS — K5190 Ulcerative colitis, unspecified, without complications: Secondary | ICD-10-CM | POA: Diagnosis present

## 2023-08-08 DIAGNOSIS — K51311 Ulcerative (chronic) rectosigmoiditis with rectal bleeding: Secondary | ICD-10-CM

## 2023-08-08 DIAGNOSIS — K6389 Other specified diseases of intestine: Secondary | ICD-10-CM | POA: Diagnosis not present

## 2023-08-08 MED ORDER — PREDNISONE 10 MG PO TABS: ORAL_TABLET | ORAL | 0 refills | Status: AC

## 2023-08-08 MED ORDER — SODIUM CHLORIDE 0.9 % IV SOLN: 500.0000 mL | Freq: Once | INTRAVENOUS | Status: DC

## 2023-08-08 NOTE — Progress Notes (Signed)
[facility] History and Physical   Primary Care Physician:  [name], MD   Reason for Procedure:    Encounter Diagnosis  Name Primary?   Rectal bleeding Yes     Plan:    colonoscopy     HPI: Richard Carey is a 62 y.o. male seen 06/30/23 w/ following hx:   02/14/2023 colonoscopy with segmental colitis associated with diverticulosis found in addition to hemorrhoids.    04/04/2023 patient seen in clinic by Dr. Leone Payor and discussed that he definitely had bleeding from hemorrhoids with question of scad contributing.  At that time recommended treatment of hemorrhoids with Hydrocortisone suppositories nightly x 12 nights.  At that point hard to tell if Cipro and Metronidazole made a difference for him, discussed possible Budesonide versus Prednisone depending clinical course.  Also discussed possible repeat endoscopic evaluation to determine status of colitis.    06/19/2023 patient called in and described rectal bleeding.  At that time recommended stool studies if having diarrhea.  Add Benefiber 1 teaspoon 3 times daily.  Also sent prescription for Prednisone 20 mg daily x 7 days, then decrease by 5 mg every 7 days until he completed the course.  Patient reported he was not having diarrhea.  Told to use the fiber and Prednisone.    Today, patient describes that none of his symptoms really changed since seeing Dr. Leone Payor back in May.  He continues with bowel movements which seem to be even more frequent and more explosive.  Tells me they are small there is a lot of urgency and a lot of gas when he does have them, now 5-6 times a day, sometimes solid and other times just particle matter, he does continue to have bright red blood occasionally, this is unpredictable, although typically first thing in the morning he will just pass bright red blood and then after that may not see any more may see it 1 other time.  Describes that his daughter is 2 years old and has a diagnosis of Crohn's  and thinks he may have C. difficile.  He tells me that even after the suppositories there is no real change and currently he is on Prednisone 20 mg daily which she has not tapered yet and has not noticed a real change either.  He did have 1 solid looking stool in this timeframe over the past week of using prednisone but nothing to really write home about.  Concerned in regards to ongoing symptoms and tells me he travels for work and with the frequency of stooling and urgency it would be hard for him to do that.    Denies fever, chills or weight loss.  C diff toxin PCR was neg  Lab Results  Component Value Date   WBC 7.9 06/30/2023   HGB 13.5 06/30/2023   HCT 41.2 06/30/2023   MCV 83.8 06/30/2023   PLT 284.0 06/30/2023     Chemistry      Component Value Date/Time   NA 136 06/30/2023 1141   K 3.9 06/30/2023 1141   CL 100 06/30/2023 1141   CO2 29 06/30/2023 1141   BUN 21 06/30/2023 1141   CREATININE 1.17 06/30/2023 1141      Component Value Date/Time   CALCIUM 9.7 06/30/2023 1141   ALKPHOS 66 06/30/2023 1141   AST 15 06/30/2023 1141   ALT 23 06/30/2023 1141   BILITOT 0.5 06/30/2023 1141          Past Medical History:  Diagnosis Date  Acute medial meniscus tear of right knee    Allergy    Ankle pain, right    Colitis     Past Surgical History:  Procedure Laterality Date   COLONOSCOPY     KNEE ARTHROSCOPY Right 09/09/2015   Procedure: RIGHT ARTHROSCOPY KNEE;  Surgeon: Gean Birchwood, MD;  Location:  SURGERY CENTER;  Service: Orthopedics;  Laterality: Right;   LYMPH NODE BIOPSY     left side neck   MOUTH SURGERY     NASAL SINUS SURGERY     sinus scraped /1991   TONSILLECTOMY     TOOTH EXTRACTION      Prior to Admission medications   Medication Sig Start Date End Date Taking? Authorizing Provider  diphenhydramine-acetaminophen (TYLENOL PM) 25-500 MG TABS tablet Take 1 tablet by mouth at bedtime as needed.   Yes [provider]    Current  Outpatient Medications  Medication Sig Dispense Refill   diphenhydramine-acetaminophen (TYLENOL PM) 25-500 MG TABS tablet Take 1 tablet by mouth at bedtime as needed.     Current Facility-Administered Medications  Medication Dose Route Frequency Provider Last Rate Last Admin   0.9 %  sodium chloride infusion  500 mL Intravenous Once Iva Boop, MD        Allergies as of 08/08/2023 - Review Complete 08/08/2023  Allergen Reaction Noted   Keflex [cephalexin] Hives 02/15/2014    Family History  Problem Relation Age of Onset   Aneurysm Paternal Grandfather    Colon cancer Neg Hx    Rectal cancer Neg Hx    Stomach cancer Neg Hx    Esophageal cancer Neg Hx     Social History   Socioeconomic History   Marital status: Married    Spouse name: Not on file   Number of children: Not on file   Years of education: Not on file   Highest education level: Not on file  Occupational History   Not on file  Tobacco Use   Smoking status: Never   Smokeless tobacco: Never  Vaping Use   Vaping status: Never Used  Substance and Sexual Activity   Alcohol use: Yes    Comment: social   Drug use: No   Sexual activity: Not on file  Other Topics Concern   Not on file  Social History Narrative   Patient is married he has 1 daughter (she has Crohn's disease).  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   No alcohol never smoker drinks many cups of tea each day.   Social Determinants of Health   Financial Resource Strain: Not on file  Food Insecurity: Not on file  Transportation Needs: Not on file  Physical Activity: Not on file  Stress: Not on file  Social Connections: Unknown (03/31/2022)   Received from [facility], [facility]   Social Network    Social Network: Not on file  Intimate Partner Violence: Unknown (02/21/2022)   Received from [facility], [facility]   HITS    Physically Hurt: Not on file    Insult or Talk Down To: Not on file     Threaten Physical Harm: Not on file    Scream or Curse: Not on file    Review of Systems:  All other review of systems negative except as mentioned in the HPI.  Physical Exam: Vital signs BP 126/88   Pulse 83   Temp 98 F (36.7 C)   Ht 5\' 8"  (1.727 m)  Wt 225 lb (102.1 kg)   SpO2 95%   BMI 34.21 kg/m   General:   Alert,  Well-developed, well-nourished, pleasant and cooperative in NAD Lungs:  Clear throughout to auscultation.   Heart:  Regular rate and rhythm; no murmurs, clicks, rubs,  or gallops. Abdomen:  Soft, nontender and nondistended. Normal bowel sounds.   Neuro/Psych:  Alert and cooperative. Normal mood and affect. A and O x 3   @Ava Deguire  Sena Slate, MD, [facility] [phone] (pager) 08/08/2023 1:01 PM@

## 2023-08-08 NOTE — Patient Instructions (Addendum)
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  Biopsies taken.  I am starting prednisone again but at a higher dose and then we will determine next treatment steps (long-term) after pathology review and follow-up.  I appreciate the opportunity to care for you. Iva Boop, MD, [facility]  Please read handouts provided. Await pathology results. Prednisone 40 mg everyday, taper weekly over 6 weeks. Will determine long-term treatment plan.   YOU HAD AN ENDOSCOPIC PROCEDURE TODAY AT THE [facility]:   Refer to the procedure report that was given to you for any specific questions about what was found during the examination.  If the procedure report does not answer your questions, please call your gastroenterologist to clarify.  If you requested that your care partner not be given the details of your procedure findings, then the procedure report has been included in a sealed envelope for you to review at your convenience later.  YOU SHOULD EXPECT: Some feelings of bloating in the abdomen. Passage of more gas than usual.  Walking can help get rid of the air that was put into your GI tract during the procedure and reduce the bloating. If you had a lower endoscopy (such as a colonoscopy or flexible sigmoidoscopy) you may notice spotting of blood in your stool or on the toilet paper. If you underwent a bowel prep for your procedure, you may not have a normal bowel movement for a few days.  Please Note:  You might notice some irritation and congestion in your nose or some drainage.  This is from the oxygen used during your procedure.  There is no need for concern and it should clear up in a day or so.  SYMPTOMS TO REPORT IMMEDIATELY:  Following lower endoscopy (colonoscopy or flexible sigmoidoscopy):  Excessive amounts of blood in the stool  Significant tenderness or  worsening of abdominal pains  Swelling of the abdomen that is new, acute  Fever of 100F or higher.  For urgent or emergent issues, a gastroenterologist can be reached at any hour by calling [phone]. Do not use MyChart messaging for urgent concerns.    DIET:  We do recommend a small meal at first, but then you may proceed to your regular diet.  Drink plenty of fluids but you should avoid alcoholic beverages for 24 hours.  ACTIVITY:  You should plan to take it easy for the rest of today and you should NOT DRIVE or use heavy machinery until tomorrow (because of the sedation medicines used during the test).    FOLLOW UP: Our staff will call the number listed on your records the next business day following your procedure.  We will call around 7:15- 8:00 am to check on you and address any questions or concerns that you may have regarding the information given to you following your procedure. If we do not reach you, we will leave a message.     If any biopsies were taken you will be contacted by phone or by letter within the next 1-3 weeks.  Please call us at [phone] if you have not heard about the biopsies in 3 weeks.    SIGNATURES/CONFIDENTIALITY: You and/or your care partner have signed paperwork which will be entered into your electronic medical record.  These signatures attest to the fact that that the information above on your After Visit Summary has been reviewed and  is understood.  Full responsibility of the confidentiality of this discharge information lies with you and/or your care-partner.

## 2023-08-08 NOTE — Progress Notes (Signed)
Called to room to assist during endoscopic procedure.  Patient ID and intended procedure confirmed with present staff. Received instructions for my participation in the procedure from the performing physician.  

## 2023-08-08 NOTE — Op Note (Signed)
[facility] Patient Name: Richard Carey Procedure Date: 08/08/2023 1:04 PM MRN: [id number] Endoscopist: Iva Boop , MD, [id number] Age: 61 Referring MD:  Date of Birth: 1961/03/09 Gender: Male Account #: [id number] Procedure:                Colonoscopy Indications:              Rectal bleeding, recent dx scd 01/2023, didi not                            respond to antibiotics, budesonide or 20 mg                            prednions and taper Medicines:                Monitored Anesthesia Care Procedure:                Pre-Anesthesia Assessment:                           - Prior to the procedure, a History and Physical                            was performed, and patient medications and                            allergies were reviewed. The patient's tolerance of                            previous anesthesia was also reviewed. The risks                            and benefits of the procedure and the sedation                            options and risks were discussed with the patient.                            All questions were answered, and informed consent                            was obtained. Prior Anticoagulants: The patient has                            taken no anticoagulant or antiplatelet agents. ASA                            Grade Assessment: I - A normal, healthy patient.                            After reviewing the risks and benefits, the patient                            was deemed in satisfactory condition to undergo the  procedure.                           After obtaining informed consent, the colonoscope                            was passed under direct vision. Throughout the                            procedure, the patient's blood pressure, pulse, and                            oxygen saturations were monitored continuously. The                            Olympus CF-HQ190L ([id number]) Colonoscope was                             introduced through the anus and advanced to the the                            terminal ileum, with identification of the                            appendiceal orifice and IC valve. The colonoscopy                            was performed without difficulty. The patient                            tolerated the procedure well. The quality of the                            bowel preparation was good. The terminal ileum,                            ileocecal valve, appendiceal orifice, and rectum                            were photographed. Scope In: 1:19:30 PM Scope Out: 1:31:26 PM Scope Withdrawal Time: 0 hours 9 minutes 47 seconds  Total Procedure Duration: 0 hours 11 minutes 56 seconds  Findings:                 The perianal and digital rectal examinations were                            normal. Pertinent negatives include normal prostate                            (size, shape, and consistency).                           Inflammation characterized by altered vascularity,  congestion (edema), erosions, erythema, friability                            and aphthous ulcerations was found in a continuous                            and circumferential pattern from the rectum to the                            sigmoid colon. The mid sigmoid colon, the distal                            sigmoid colon, the descending colon, the splenic                            flexure, the transverse colon, the hepatic flexure,                            the ascending colon and the cecum were spared. The                            inflammation was moderate in severity, and when                            compared to previous examinations, the findings are                            worsened. Biopsies were taken with a cold forceps                            for histology. Verification of patient                            identification for the specimen was done. Estimated                             blood loss was minimal.                           Multiple diverticula were found in the sigmoid                            colon.                           External and internal hemorrhoids were found.                           Biopsies were taken with a cold forceps in the                            proximal sigmoid colon, in the descending colon, in  the transverse colon, in the ascending colon and in                            the cecum for histology. Verification of patient                            identification for the specimen was done. Estimated                            blood loss was minimal. Complications:            No immediate complications. Estimated Blood Loss:     Estimated blood loss was minimal. Impression:               - Proctosigmoid ulcerative colitis. Inflammation                            was found from the rectum to the sigmoid colon.                            This was moderate in severity, worsened compared to                            previous examinations. Biopsied. 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                           - External and internal hemorrhoids.                           - Biopsies were taken with a cold forceps for                            histology in the proximal sigmoid colon, in the                            descending colon, in the transverse colon, in the                            ascending colon and in the cecum. Recommendation:           - Patient has a contact number available for                            emergencies. The signs and symptoms of potential                            delayed complications were discussed with the  patient. Return to normal activities tomorrow.                            Written discharge  instructions were provided to the                            patient.                           - Resume previous diet.                           - Continue present medications.                           - Await pathology results.                           - Repeat colonoscopy is recommended. The                            colonoscopy date will be determined after pathology                            results from today's exam become available for                            review.                           - Prednisone 40 mg qd taper weekly over 6 weeks and                            will determine long-term treatment plan Iva Boop, MD 08/08/2023 1:42:45 PM This report has been signed electronically.

## 2023-08-08 NOTE — Progress Notes (Signed)
Report given to PACU, vss 

## 2023-08-09 ENCOUNTER — Telehealth: Payer: Self-pay

## 2023-08-09 NOTE — Telephone Encounter (Signed)
  Follow up Call-     08/08/2023   12:40 PM 02/14/2023   12:37 PM  Call back number  Post procedure Call Back phone  # [phone]  Permission to leave phone message Yes Yes     Patient questions:  Do you have a fever, pain , or abdominal swelling? No. Pain Score  0 *  Have you tolerated food without any problems? Yes.    Have you been able to return to your normal activities? Yes.    Do you have any questions about your discharge instructions: Diet   No. Medications  No. Follow up visit  No.  Do you have questions or concerns about your Care? No.  Actions: * If pain score is 4 or above: No action needed, pain <4.

## 2023-08-14 ENCOUNTER — Telehealth: Payer: Self-pay | Admitting: Internal Medicine

## 2023-08-14 LAB — SURGICAL PATHOLOGY

## 2023-08-14 MED ORDER — MESALAMINE 1.2 G PO TBEC: 2.4000 g | DELAYED_RELEASE_TABLET | Freq: Every day | ORAL | 3 refills | Status: DC

## 2023-08-14 NOTE — Telephone Encounter (Signed)
   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    Dispense:  180 tablet    Refill:  3    He know to get Rx  Please call him and do this:  He needs appt me in 10-12 weeks use a nurse visit in first 2 weeks of December

## 2023-08-15 NOTE — Telephone Encounter (Signed)
Pt was notified of Dr. Leone Payor recommendations to schedule pt for an office visit in the beginning of December.  Pt was scheduled for 10/26/2023 at 8:50 AM. Pt made aware. Pt verbalized understanding with all questions answered.

## 2023-10-09 ENCOUNTER — Telehealth: Payer: Self-pay | Admitting: Physician Assistant

## 2023-10-09 ENCOUNTER — Telehealth: Payer: Self-pay | Admitting: Internal Medicine

## 2023-10-09 NOTE — Telephone Encounter (Signed)
Error

## 2023-10-09 NOTE — Telephone Encounter (Signed)
Inbound call from patient now requesting a call back [phone]. Please advise, thank you

## 2023-10-09 NOTE — Telephone Encounter (Signed)
Patient called states he is still having rectal bleeding and is seeking further advise possibly speak with Dr. Leone Payor. Request a call back at [phone].

## 2023-10-10 NOTE — Telephone Encounter (Signed)
Change the Lialda to 2.4 g bid   We can send a new rx if needed

## 2023-10-10 NOTE — Telephone Encounter (Signed)
Returned patient call & he stated that he was started on Lialda in September and since then his bowels & bleeding improved 95%, up until the last 2 weeks. He feels like he is back to square one. Bleeding has increased again. This morning he coughed & experienced bleeding/incontinence. Denies any pain. He's scheduled to see Dr. Leone Payor 10/26/23 & would like to know what he should do until then, or if he needs to be seen sooner.

## 2023-10-10 NOTE — Telephone Encounter (Signed)
Advised patient of MD recommendations & asked that he give [place] a call if symptoms are not improving or worsening. Pt will call at the end of the week with an update. Pt verbalized all understanding.

## 2023-10-13 ENCOUNTER — Other Ambulatory Visit: Payer: Self-pay

## 2023-10-13 DIAGNOSIS — R197 Diarrhea, unspecified: Secondary | ICD-10-CM

## 2023-10-13 DIAGNOSIS — K625 Hemorrhage of anus and rectum: Secondary | ICD-10-CM

## 2023-10-13 DIAGNOSIS — K51311 Ulcerative (chronic) rectosigmoiditis with rectal bleeding: Secondary | ICD-10-CM

## 2023-10-13 MED ORDER — PREDNISONE 10 MG PO TABS
ORAL_TABLET | ORAL | 0 refills | Status: AC
Start: 2023-10-13 — End: 2023-11-10

## 2023-10-13 NOTE — Telephone Encounter (Signed)
Patient called and stated that he was not experiencing any change per the message below. Patient is requesting a call back. Please advise.

## 2023-10-13 NOTE — Telephone Encounter (Signed)
Dr. Leone Payor Pt Pt stated he has not noticed any change since increasing the Lialda to 2.4. Pt stated that he is having multiple BM throughout the day that consist of BRB mixed in stool.  No abdominal pain, nor nausea. Pt stated that he had a low garde temp last night of 99.5 but that has resolved this morning. Please review and advise as DOD

## 2023-10-13 NOTE — Telephone Encounter (Signed)
Pt made aware of Dr. Milas Hock recommendations. Prescription was sent to pt pharmacy.  Pt made aware.  Pt verbalized understanding with all questions answered.

## 2023-10-26 ENCOUNTER — Ambulatory Visit: Payer: BC Managed Care – PPO | Admitting: Internal Medicine

## 2023-10-26 ENCOUNTER — Encounter: Payer: Self-pay | Admitting: Internal Medicine

## 2023-10-26 VITALS — BP 148/90 | HR 84 | Ht 68.0 in | Wt 233.0 lb

## 2023-10-26 DIAGNOSIS — R152 Fecal urgency: Secondary | ICD-10-CM | POA: Diagnosis not present

## 2023-10-26 DIAGNOSIS — K51311 Ulcerative (chronic) rectosigmoiditis with rectal bleeding: Secondary | ICD-10-CM | POA: Diagnosis not present

## 2023-10-26 MED ORDER — HYOSCYAMINE SULFATE ER 0.375 MG PO TB12: 0.3750 mg | ORAL_TABLET | Freq: Two times a day (BID) | ORAL | 2 refills | Status: DC

## 2023-10-26 MED ORDER — MESALAMINE 1.2 G PO TBEC: 4.8000 g | DELAYED_RELEASE_TABLET | Freq: Every day | ORAL | 3 refills | Status: DC

## 2023-10-26 NOTE — Patient Instructions (Signed)
We have sent the following medications to your pharmacy for you to pick up at your convenience: Lialda, generic Levsin  _______________________________________________________  If your blood pressure at your visit was 140/90 or greater, please contact your primary care physician to follow up on this.  _______________________________________________________  If you are age 63 or older, your body mass index should be between 23-30. Your Body mass index is 35.43 kg/m. If this is out of the aforementioned range listed, please consider follow up with your Primary Care Provider.  If you are age 56 or younger, your body mass index should be between 19-25. Your Body mass index is 35.43 kg/m. If this is out of the aformentioned range listed, please consider follow up with your Primary Care Provider.   ________________________________________________________  The  GI providers would like to encourage you to use [facility] to communicate with providers for non-urgent requests or questions.  Due to long hold times on the telephone, sending your provider a message by [facility] may be a faster and more efficient way to get a response.  Please allow 48 business hours for a response.  Please remember that this is for non-urgent requests.  _______________________________________________________  I appreciate the opportunity to care for you. Stan Head, MD, [facility]

## 2023-10-26 NOTE — Progress Notes (Deleted)
   Richard Carey 62 y.o. [birth year] [id number]  Assessment & Plan:      Subjective:   Chief Complaint:  HPI  Wt Readings from Last 3 Encounters:  08/08/23 225 lb (102.1 kg)  06/30/23 225 lb (102.1 kg)  04/04/23 230 lb 12.8 oz (104.7 kg)     Allergies  Allergen Reactions   Keflex [Cephalexin] Hives   No outpatient medications have been marked as taking for the 10/26/23 encounter (Appointment) with Iva Boop, MD.   Past Medical History:  Diagnosis Date   Acute medial meniscus tear of right knee    Allergy    Ankle pain, right    Colitis    Past Surgical History:  Procedure Laterality Date   COLONOSCOPY     KNEE ARTHROSCOPY Right 09/09/2015   Procedure: RIGHT ARTHROSCOPY KNEE;  Surgeon: Gean Birchwood, MD;  Location: [facility];  Service: Orthopedics;  Laterality: Right;   LYMPH NODE BIOPSY     left side neck   MOUTH SURGERY     NASAL SINUS SURGERY     sinus scraped /1991   TONSILLECTOMY     TOOTH EXTRACTION     Social History   Social History Narrative   Patient is married he has 1 daughter (she has Crohn's disease).  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   No alcohol never smoker drinks many cups of tea each day.   family history includes Aneurysm in his paternal grandfather.   Review of Systems   Objective:   Physical Exam

## 2023-10-26 NOTE — Progress Notes (Signed)
Gastroenterology summary:  Left-sided ulcerative colitis:  February 14, 2023-colonoscopy for rectal bleeding, findings suggestive of segmental colitis associated with diverticulosis and hemorrhoids.  Treated with Cipro and metronidazole but had persistent bleeding issues despite treatment for hemorrhoids as well.  Then treated with prednisone in summer 2024 with benefit.  Colonoscopy 08/08/2023 with left-sided ulcerative colitis, biopsies this time show changes of chronic colitis.  Treatment with mesalamine in the form of Lialda 2.4 g daily.  Then in November flared with bleeding and some diarrhea and prednisone taper restarted after a brief period of time on mesalamine 4.8 g daily.   08/13/2012 colonoscopy Impression: Diminutive sessile polyp was found in the sigmoid colon; polypectomy was performed with cold snare Mild diverticulosis was noted in sigmoid colon The colon mucosa was otherwise normal good prep Path: Surgical [P], sigmoid, polyp - HYPERPLASTIC POLYP. 02/14/2023 colonoscopy Impression:  - Altered vascular, congested, hemorrhagic and inflamed mucosa in the sigmoid colon. Biopsied. 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 - External and internal hemorrhoids. - The examined portion of the ileum was normal. - The examination was otherwise normal on direct and retroflexion views. - Biopsies were taken with a cold forceps from the ascending colon, transverse colon and descending colon for evaluation of microscopic colitis. Path: Diagnosis 1. Surgical [P], colon, transverse and ascending (normal appearing colon) - BENIGN COLONIC MUCOSA WITHOUT DIAGNOSTIC ABNORMALITY - NEGATIVE FOR SIGNIFICANT ACUTE INFLAMMATION, CHRONIC CHANGES, GRANULOMATA, DYSPLASIA OR MALIGNANCY 2. Surgical [P], colon, sigmoid - CHRONIC ACTIVE COLITIS - NEGATIVE FOR GRANULOMATA, DYSPLASIA OR MALIGNANCY - SEE NOTE 08/08/2023  colonoscopy Impression:  - Proctosigmoid ulcerative colitis. Inflammation was found from the rectum to the sigmoid colon. This was moderate in severity, worsened compared to previous examinations. Biopsied. Looks like ulcerative proctosigmoiditis today as oppsed to SCAD - Diverticulosis in the sigmoid colon. - External and internal hemorrhoids. - Biopsies were taken with a cold forceps for histology in the proximal sigmoid colon, in the descending colon, in the transverse colon, in the ascending colon and in the cecum. Path: FINAL DIAGNOSIS        1. Surgical [P], colon, transverse, ascending, and cecum :       - UNREMARKABLE COLONIC MUCOSA.       - NEGATIVE FOR MICROSCOPIC COLITIS, DYSPLASIA, AND MALIGNANCY.        2. Surgical [P], colon, descending and proximal sigmoid :       - UNREMARKABLE COLONIC MUCOSA.       - NEGATIVE FOR MICROSCOPIC COLITIS, DYSPLASIA, AND MALIGNANCY.        3. Surgical [P], colon, rectum and distal sigmoid :       - MARKED CHRONIC ACTIVE PROCTOCOLITIS.       - NEGATIVE FOR GRANULOMAS, DYSPLASIA, AND MALIGNANCY.       - SEE NOTE.      ------------------------------------------------------------------------------------------------- Chief Complaint: Rectal bleeding, bowel urgency  HPI:  02/14/2023 colonoscopy with segmental colitis associated with diverticulosis found in addition to hemorrhoids.    04/04/2023 patient seen in clinic by Dr. Leone Payor and discussed that he definitely had bleeding from hemorrhoids with question of scad contributing.  At that time recommended treatment of hemorrhoids with Hydrocortisone suppositories nightly x 12 nights.  At that point hard to tell if Cipro and Metronidazole made a difference for him, discussed possible Budesonide versus Prednisone depending clinical course.  Also discussed possible repeat endoscopic evaluation to determine status of colitis.    06/19/2023 patient  called in for rectal bleeding.  At that time recommended  stool studies if having diarrhea.  Added Benefiber 1 teaspoon 3 times daily.  Also sent prescription for Prednisone 20 mg daily x 7 days, then decrease by 5 mg every 7 days until he completed the course.  Patient reported he was not having diarrhea.  Told to use the fiber and Prednisone.   Interval History: Patient started steroid taper in July for rectal bleeding and Lialda 2.4 po daily in September for newly diagnosed Proctosigmoid ulcerative colitis and had been doing well until mid November when he started to experience rectal bleeding with every bowel movement and up to 10 loose stools for two weeks. Dr. Leone Payor increased Lialda to 2.4g 2 tablets BID. Patient reported no improvement, so he called office and Dr. Tomasa Rand started him on prednisone taper on 11/23. Currently taking 30mg  po daily now. Within five days rectal bleeding improved. Patient currently having 3-4 semi formed stools per day. Reports nocturnal symptoms and urgency. Denies abdominal pain.  Denies extraintestinal manifestations.     Going on business trip this weekend and to [place] on 14th for his daughters 21st birthday.   Wt Readings from Last 3 Encounters:  10/26/23 233 lb (105.7 kg)  08/08/23 225 lb (102.1 kg)  06/30/23 225 lb (102.1 kg)   Past Medical History:  Diagnosis Date   Acute medial meniscus tear of right knee    Allergy    Ankle pain, right    Colitis    Past Surgical History:  Procedure Laterality Date   COLONOSCOPY     KNEE ARTHROSCOPY Right 09/09/2015   Procedure: RIGHT ARTHROSCOPY KNEE;  Surgeon: Gean Birchwood, MD;  Location: [facility];  Service: Orthopedics;  Laterality: Right;   LYMPH NODE BIOPSY     left side neck   MOUTH SURGERY     NASAL SINUS SURGERY     sinus scraped /1991   TONSILLECTOMY     TOOTH EXTRACTION     Allergies as of 10/26/2023 - Review Complete 10/26/2023  Allergen Reaction Noted   Keflex [cephalexin] Hives 02/15/2014   Family History  Problem  Relation Age of Onset   Aneurysm Paternal Grandfather    Colon cancer Neg Hx    Rectal cancer Neg Hx    Stomach cancer Neg Hx    Esophageal cancer Neg Hx    Review of Systems:    Constitutional: No weight loss, fever, chills, weakness or fatigue HEENT: Eyes: No change in vision               Ears, Nose, Throat:  No change in hearing or congestion Skin: No rash or itching Cardiovascular: No chest pain, chest pressure or palpitations   Respiratory: No SOB or cough Gastrointestinal: See HPI and otherwise negative Genitourinary: No dysuria or change in urinary frequency Neurological: No headache, dizziness or syncope Musculoskeletal: No new muscle or joint pain Hematologic: No bleeding or bruising Psychiatric: No history of depression or anxiety  Physical Exam:  Vital signs: BP (!) 148/90   Pulse 84   Ht 5\' 8"  (1.727 m)   Wt 233 lb (105.7 kg)   BMI 35.43 kg/m   Throat: Oral cavity and pharynx without inflammation, swelling or lesion.  Respiratory: Respirations even and unlabored. Lungs clear to auscultation bilaterally.   No wheezes, crackles, or rhonchi.  Cardiovascular: Normal S1, S2. No MRG. Regular rate and rhythm. No peripheral edema, cyanosis or pallor.  Gastrointestinal:  Soft, nondistended, nontender. No rebound  or guarding. Hyperactive bowel sounds. No appreciable masses or hepatomegaly. Rectal:  Not performed.  Skin:   Dry and intact without significant lesions or rashes. Psychiatric: Oriented to person, place and time. Demonstrates good judgement and reason without abnormal affect or behaviors.  RELEVANT LABS AND IMAGING: CBC    Latest Ref Rng & Units 06/30/2023   11:41 AM 01/12/2023    3:27 PM 10/27/2019    1:05 PM  CBC  WBC 4.0 - 10.5 K/uL 7.9  6.5  4.7   Hemoglobin 13.0 - 17.0 g/dL 31.5  17.6  16.0   Hematocrit 39.0 - 52.0 % 41.2  41.3  43.6   Platelets 150.0 - 400.0 K/uL 284.0  246.0  203     CMP     Component Value Date/Time   NA 136 06/30/2023 1141   K  3.9 06/30/2023 1141   CL 100 06/30/2023 1141   CO2 29 06/30/2023 1141   GLUCOSE 118 (H) 06/30/2023 1141   BUN 21 06/30/2023 1141   CREATININE 1.17 06/30/2023 1141   CALCIUM 9.7 06/30/2023 1141   PROT 7.3 06/30/2023 1141   ALBUMIN 4.5 06/30/2023 1141   AST 15 06/30/2023 1141   ALT 23 06/30/2023 1141   ALKPHOS 66 06/30/2023 1141   BILITOT 0.5 06/30/2023 1141   GFRNONAA >60 10/27/2019 1305   GFRAA >60 10/27/2019 1305     Assessment: 62 year old male patient with newly diagnosed ulcerative rectosigmoiditis discovered on colonoscopy in September and started on Lialda 2.4 g p.o. daily.  Patient was doing well until recently with increased rectal bleeding, diarrhea, and urgency.  Patient was given steroid taper and increase mesalamine to 4.8 g/day.  Patient's symptoms did not improve about 5 days after starting prednisone.  Patient has only been on the increased dose of mesalamine for a few weeks and we would like to a little bit more time before considering other alternative therapies.  Patient can use Levbid for abdominal cramping and urgency.  Patient would like to wait until he comes back from his trips to discuss how he is doing and if he should proceed with different therapy.  Will need to see how he does on mesalamine 4.8 g daily.  Reasonable likelihood he will need biologic therapy he is amenable to that should that be the case.  Would think adalimumab most likely combined with 6-MP for 6 months would be the choice.  Encounter Diagnoses  Name Primary?   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 May served as a Neurosurgeon for this visit.  Iva Boop, MD, [facility] 10/26/2023 12:35 PM    Cc: [name], MD

## 2023-11-16 ENCOUNTER — Telehealth: Payer: Self-pay | Admitting: Internal Medicine

## 2023-11-16 NOTE — Telephone Encounter (Signed)
Inbound call from patient requesting a call from a nurse to discuss change in symptoms. Did not discuss further details. Please advise, thank you.

## 2023-11-17 ENCOUNTER — Other Ambulatory Visit: Payer: Self-pay | Admitting: Internal Medicine

## 2023-11-17 ENCOUNTER — Other Ambulatory Visit: Payer: Self-pay

## 2023-11-17 MED ORDER — PREDNISONE 10 MG PO TABS: 40.0000 mg | ORAL_TABLET | Freq: Every day | ORAL | 0 refills | Status: DC

## 2023-11-17 NOTE — Telephone Encounter (Signed)
Needs to go back to prednisone 40 mg daily (10 mg tabs # 120) no refills  I have openings now on 1/2 please see if he can see me then vs his next available chance when I have an opening (there are banding and 7 day hold spots open)

## 2023-11-17 NOTE — Telephone Encounter (Signed)
Patient can come see you 11/23/23 at 10:10 am. Agrees to restart prednisone at 40 mg daily.

## 2023-11-17 NOTE — Telephone Encounter (Signed)
Patient noted a gradual increase of symptoms when he started prednisone "2 tablets" and this worsened when he was on prednisone "1 tablet." He completed the prednisone taper 11/10/23. Reports stools 8 to 10 times a day. Stools are soft unformed to diarrhea with blood at times. There is urgency. He has noticed increased intestinal gas that is almost painful. Confirmed he is taking Lialda 2.4 g BID and Levbid BID. His follow up appointment is 12/14/23.

## 2023-11-23 ENCOUNTER — Encounter: Payer: Self-pay | Admitting: Internal Medicine

## 2023-11-23 ENCOUNTER — Other Ambulatory Visit (INDEPENDENT_AMBULATORY_CARE_PROVIDER_SITE_OTHER): Payer: BC Managed Care – PPO

## 2023-11-23 ENCOUNTER — Ambulatory Visit: Payer: BC Managed Care – PPO | Admitting: Internal Medicine

## 2023-11-23 VITALS — BP 128/74 | HR 92 | Ht 68.0 in | Wt 229.6 lb

## 2023-11-23 DIAGNOSIS — K51311 Ulcerative (chronic) rectosigmoiditis with rectal bleeding: Secondary | ICD-10-CM | POA: Diagnosis not present

## 2023-11-23 LAB — CBC WITH DIFFERENTIAL/PLATELET
Basophils Absolute: 0 10*3/uL (ref 0.0–0.1)
Basophils Relative: 0.2 % (ref 0.0–3.0)
Eosinophils Absolute: 0.2 10*3/uL (ref 0.0–0.7)
Eosinophils Relative: 1.6 % (ref 0.0–5.0)
HCT: 35.6 % — ABNORMAL LOW (ref 39.0–52.0)
Hemoglobin: 11.5 g/dL — ABNORMAL LOW (ref 13.0–17.0)
Lymphocytes Relative: 9.9 % — ABNORMAL LOW (ref 12.0–46.0)
Lymphs Abs: 0.9 10*3/uL (ref 0.7–4.0)
MCHC: 32.4 g/dL (ref 30.0–36.0)
MCV: 81.9 fL (ref 78.0–100.0)
Monocytes Absolute: 0.6 10*3/uL (ref 0.1–1.0)
Monocytes Relative: 6.4 % (ref 3.0–12.0)
Neutro Abs: 7.5 10*3/uL (ref 1.4–7.7)
Neutrophils Relative %: 81.9 % — ABNORMAL HIGH (ref 43.0–77.0)
Platelets: 320 10*3/uL (ref 150.0–400.0)
RBC: 4.35 Mil/uL (ref 4.22–5.81)
RDW: 14.4 % (ref 11.5–15.5)
WBC: 9.1 10*3/uL (ref 4.0–10.5)

## 2023-11-23 LAB — COMPREHENSIVE METABOLIC PANEL
ALT: 16 U/L (ref 0–53)
AST: 10 U/L (ref 0–37)
Albumin: 3.9 g/dL (ref 3.5–5.2)
Alkaline Phosphatase: 62 U/L (ref 39–117)
BUN: 16 mg/dL (ref 6–23)
CO2: 27 meq/L (ref 19–32)
Calcium: 8.9 mg/dL (ref 8.4–10.5)
Chloride: 100 meq/L (ref 96–112)
Creatinine, Ser: 1.08 mg/dL (ref 0.40–1.50)
GFR: 73.02 mL/min (ref >60.00)
Glucose, Bld: 266 mg/dL — ABNORMAL HIGH (ref 70–99)
Potassium: 3.5 meq/L (ref 3.5–5.1)
Sodium: 134 meq/L — ABNORMAL LOW (ref 135–145)
Total Bilirubin: 0.4 mg/dL (ref 0.2–1.2)
Total Protein: 6.6 g/dL (ref 6.0–8.3)

## 2023-11-23 NOTE — Progress Notes (Signed)
 HIEU HERMS 63 y.o. 02-Jan-1961 [id number]  Assessment & Plan:   Encounter Diagnosis  Name Primary?   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  Reviewed risks benefits and indications of both of these medications and the need for lab monitoring as well.  I have provided handouts on immunomodulators and biologic agents provided by the [employer] [place].  I did review the increased risk of infections and the thought that biologic medications may increase the risk of things like lymphoma and leukemia though chronically flaring inflammatory bowel disease may do the same.  So even though these are high risk medications, he is not responding to other treatment and we need to move in this direction.  I have recommended he pursue Shingrix vaccine.  He will continue prednisone  for the time being and we will continue the mesalamine  though anticipate dropping mesalamine  in the future.  It is costly though he has a 59-month supply.  Will go ahead with reducing to 30 mg daily prednisone  and I wanted to update me in a week regarding signs and symptoms.  I think he is going to need to hold at some dose while we initiate the biologic and immunomodulator.  Plan to prescribe the biologic and immunomodulators once lab results are in.  He will be away traveling the second half of January FYI.  Will have him follow-up with me in late March.  He can try taking the hyoscyamine  once a day because he is having some dry mouth with that he is on that the try to reduce the urgency and he thinks it may be helping.  Low fiber diet for symptom relief.  Though he is not using he certainly could use Imodium  as needed at times.   Subjective:  Gastroenterology summary:   Left-sided ulcerative colitis:   February 14, 2023-colonoscopy for rectal bleeding, findings suggestive of segmental colitis associated  with diverticulosis and hemorrhoids.  Treated with Cipro  and metronidazole  but had persistent bleeding issues despite treatment for hemorrhoids as well.  Then treated with prednisone  in summer 2024 with benefit.   Colonoscopy 08/08/2023 with left-sided ulcerative colitis, biopsies this time show changes of chronic colitis.  Treatment with mesalamine  in the form of Lialda  2.4 g daily.  Then in November flared with bleeding and some diarrhea and prednisone  taper restarted after a brief period of time on mesalamine  4.8 g daily.     08/13/2012 colonoscopy Impression: Diminutive sessile polyp was found in the sigmoid colon; polypectomy was performed with cold snare Mild diverticulosis was noted in sigmoid colon The colon mucosa was otherwise normal good prep Path: Surgical [P], sigmoid, polyp - HYPERPLASTIC POLYP. 02/14/2023 colonoscopy Impression:  - Altered vascular, congested, hemorrhagic and inflamed mucosa in the sigmoid colon. Biopsied. 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 - External and internal hemorrhoids. - The examined portion of the ileum was normal. - The examination was otherwise normal on direct and retroflexion views. - Biopsies were taken with a cold forceps from the ascending colon, transverse colon and descending colon for evaluation of microscopic colitis. Path: Diagnosis 1. Surgical [P], colon, transverse and ascending (normal appearing colon) - BENIGN COLONIC MUCOSA WITHOUT DIAGNOSTIC ABNORMALITY - NEGATIVE FOR SIGNIFICANT ACUTE INFLAMMATION, CHRONIC CHANGES, GRANULOMATA, DYSPLASIA OR MALIGNANCY 2. Surgical [P], colon, sigmoid - CHRONIC ACTIVE COLITIS - NEGATIVE FOR GRANULOMATA, DYSPLASIA OR MALIGNANCY -  SEE NOTE 08/08/2023 colonoscopy Impression:  - Proctosigmoid ulcerative colitis. Inflammation was found from the rectum to the sigmoid colon. This was moderate in severity, worsened compared to  previous examinations. Biopsied. Looks like ulcerative proctosigmoiditis today as oppsed to SCAD - Diverticulosis in the sigmoid colon. - External and internal hemorrhoids. - Biopsies were taken with a cold forceps for histology in the proximal sigmoid colon, in the descending colon, in the transverse colon, in the ascending colon and in the cecum. Path: FINAL DIAGNOSIS        1. Surgical [P], colon, transverse, ascending, and cecum :       - UNREMARKABLE COLONIC MUCOSA.       - NEGATIVE FOR MICROSCOPIC COLITIS, DYSPLASIA, AND MALIGNANCY.        2. Surgical [P], colon, descending and proximal sigmoid :       - UNREMARKABLE COLONIC MUCOSA.       - NEGATIVE FOR MICROSCOPIC COLITIS, DYSPLASIA, AND MALIGNANCY.        3. Surgical [P], colon, rectum and distal sigmoid :       - MARKED CHRONIC ACTIVE PROCTOCOLITIS.       - NEGATIVE FOR GRANULOMAS, DYSPLASIA, AND MALIGNANCY.       - SEE NOTE.   November 2024-flaring, moved to 4.8 g mesalamine  but not helpful and required prednisone   Chief Complaint:  HPI The patient has a history of ulcerative rectosigmoiditis with bleeding, he has not responded to mesalamine  even going to 2.4 g twice daily this fall.  He has been on prednisone  multiple times.  When he came off the stone recently he had return of bleeding and he had some traveling coming so we started him back on 40 mg of prednisone  and that responded nicely.  He is about to go down to 30 mg daily soon.  He still has haphazard bleeding though not consistently stools are mushy and in pieces.  Sometimes he will awaken at night and have to defecate.  Variable numbers bowel movements over time though better than where he was without the prednisone .  He does notice if he eats a salad he will have a urgent defecation.  Fecal urgency is a major concern of his know it is better.  Wt Readings from Last 3 Encounters:  11/23/23 229 lb 9.6 oz (104.1 kg)  10/26/23 233 lb (105.7 kg)  08/08/23 225 lb (102.1 kg)     Allergies  Allergen Reactions   Keflex [Cephalexin] Hives   Current Meds  Medication Sig   diphenhydramine-acetaminophen  (TYLENOL  PM) 25-500 MG TABS tablet Take 1 tablet by mouth at bedtime as needed.   hyoscyamine  (LEVBID ) 0.375 MG 12 hr tablet TAKE 1 TABLET (0.375 MG TOTAL) BY MOUTH 2 (TWO) TIMES DAILY.   mesalamine  (LIALDA ) 1.2 g EC tablet Take 4 tablets (4.8 g total) by mouth daily with breakfast.   predniSONE  (DELTASONE ) 10 MG tablet Take 4 tablets (40 mg total) by mouth daily with breakfast.   Past Medical History:  Diagnosis Date   Acute medial meniscus tear of right knee    Allergy    Ankle pain, right    Colitis    Past Surgical History:  Procedure Laterality Date   COLONOSCOPY     KNEE ARTHROSCOPY Right 09/09/2015   Procedure: RIGHT ARTHROSCOPY KNEE;  Surgeon: Dempsey Sensor, MD;  Location: [facility];  Service: Orthopedics;  Laterality: Right;   LYMPH NODE BIOPSY     left side neck   MOUTH SURGERY  NASAL SINUS SURGERY     sinus scraped /1991   TONSILLECTOMY     TOOTH EXTRACTION     Social History   Social History Narrative   Patient is married he has 1 daughter (she has Crohn's disease).  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   No alcohol never smoker drinks many cups of tea each day.   family history includes Aneurysm in his paternal grandfather.   Review of Systems As per HPI  Objective:   Physical Exam BP 128/74   Pulse 92   Ht 5' 8 (1.727 m)   Wt 229 lb 9.6 oz (104.1 kg)   SpO2 98%   BMI 34.91 kg/m  Eyes anicteric Lungs clear Heart sounds normal S1-S2 no rubs or gallops Abdomen is soft somewhat obese nontender no organomegaly or mass bowel sounds present

## 2023-11-23 NOTE — Patient Instructions (Signed)
  Your provider has requested that you go to the basement level for lab work before leaving today. Press B on the elevator. The lab is located at the first door on the left as you exit the elevator.  Due to recent changes in healthcare laws, you may see the results of your imaging and laboratory studies on MyChart before your provider has had a chance to review them.  We understand that in some cases there may be results that are confusing or concerning to you. Not all laboratory results come back in the same time frame and the provider may be waiting for multiple results in order to interpret others.  Please give us  48 hours in order for your provider to thoroughly review all the results before contacting the office for clarification of your results.   Decrease your Prednisone  to 30mg  and update us  before you go to 20mg .  Try just taking the generic Levsin  at night.   Use a low fiber diet.  We are providing you with information to read on Immunomodulators and Biologics.  Consider getting a Shingrix vaccine.  I appreciate the opportunity to care for you. Lupita Commander, MD, [facility]

## 2023-11-26 ENCOUNTER — Encounter: Payer: Self-pay | Admitting: Internal Medicine

## 2023-11-29 ENCOUNTER — Encounter: Payer: Self-pay | Admitting: Internal Medicine

## 2023-11-29 ENCOUNTER — Other Ambulatory Visit: Payer: Self-pay | Admitting: Internal Medicine

## 2023-11-29 ENCOUNTER — Other Ambulatory Visit: Payer: Self-pay

## 2023-11-29 MED ORDER — HUMIRA (2 PEN) 40 MG/0.4ML ~~LOC~~ AJKT: 40.0000 mg | AUTO-INJECTOR | SUBCUTANEOUS | 11 refills | Status: DC

## 2023-11-29 MED ORDER — HUMIRA-CD/UC/HS STARTER 80 MG/0.8ML ~~LOC~~ AJKT: AUTO-INJECTOR | SUBCUTANEOUS | 0 refills | Status: DC

## 2023-11-29 NOTE — Telephone Encounter (Signed)
 This encounter was created in error - please disregard.

## 2023-11-29 NOTE — Progress Notes (Signed)
 Starting Humira for ulcerative colitis

## 2023-11-30 ENCOUNTER — Other Ambulatory Visit (HOSPITAL_COMMUNITY): Payer: Self-pay

## 2023-11-30 LAB — HEPATITIS B SURFACE ANTIGEN: Hepatitis B Surface Ag: NONREACTIVE

## 2023-11-30 LAB — QUANTIFERON-TB GOLD PLUS
Mitogen-NIL: 3.77 [IU]/mL
NIL: 0.03 [IU]/mL
QuantiFERON-TB Gold Plus: NEGATIVE
TB1-NIL: <0 [IU]/mL
TB2-NIL: 0 [IU]/mL

## 2023-11-30 LAB — THIOPURINE METHYLTRANSFERASE (TPMT), RBC: Thiopurine Methyltransferase, RBC: 17 nmol/h/mL

## 2023-11-30 LAB — HEPATITIS B CORE ANTIBODY, TOTAL: Hep B Core Total Ab: NONREACTIVE

## 2023-11-30 LAB — HEPATITIS B SURFACE ANTIBODY,QUALITATIVE: Hep B S Ab: NONREACTIVE

## 2023-12-01 ENCOUNTER — Telehealth: Payer: Self-pay | Admitting: Internal Medicine

## 2023-12-01 ENCOUNTER — Telehealth: Payer: Self-pay | Admitting: Pharmacy Technician

## 2023-12-01 ENCOUNTER — Other Ambulatory Visit (HOSPITAL_COMMUNITY): Payer: Self-pay

## 2023-12-01 DIAGNOSIS — K51311 Ulcerative (chronic) rectosigmoiditis with rectal bleeding: Secondary | ICD-10-CM

## 2023-12-01 MED ORDER — MERCAPTOPURINE 50 MG PO TABS: 50.0000 mg | ORAL_TABLET | Freq: Every day | ORAL | 2 refills | Status: DC

## 2023-12-01 MED ORDER — PREDNISONE 10 MG PO TABS: 20.0000 mg | ORAL_TABLET | Freq: Every day | ORAL | 0 refills | Status: DC

## 2023-12-01 NOTE — Telephone Encounter (Signed)
 Called to review plans - also in My Chart  Start 6 MP  LFT and CBC in 2 weeks  Stay at 20 mg prednisone at least 2 weeks

## 2023-12-01 NOTE — Telephone Encounter (Signed)
 Pharmacy Patient Advocate Encounter   Received notification from Patient Pharmacy that prior authorization for HUMIRA  STARTER KIT is required/requested.   Insurance verification completed.   The patient is insured through [facility] .   Per test claim: PA required; PA submitted to above mentioned insurance via CoverMyMeds Key/confirmation #/EOC [id number] Status is pending

## 2023-12-03 NOTE — Telephone Encounter (Signed)
 Received fax request with additional questions. Completed request and faxed back to number on form [phone].

## 2023-12-05 ENCOUNTER — Other Ambulatory Visit (HOSPITAL_COMMUNITY): Payer: Self-pay

## 2023-12-05 NOTE — Telephone Encounter (Signed)
 Pharmacy Patient Advocate Encounter  Received notification from [employer] that Prior Authorization for HUMIRA  STARTER KIT has been DENIED.  Full denial letter will be uploaded to the media tab. See denial reason below.   PA #/Case ID/Reference #: [id number]

## 2023-12-05 NOTE — Telephone Encounter (Signed)
 I went ahead and pended the orders  If we need to call [employer] first go ahead or send the rx

## 2023-12-05 NOTE — Telephone Encounter (Signed)
===  View-only below this line=== ----- Message ----- From: Lennie Raina SAUNDERS, CPhT Sent: 12/05/2023  10:09 AM EST To: Elspeth Munroe, RN; Nyla DELENA Basket, RN  ----- Message from Raina SAUNDERS Lennie, CPhT sent at 12/05/2023 10:09 AM EST ----- Pt insurance did preemptively approved Amjevita . Will need to fill at [facility]; call [phone].

## 2023-12-05 NOTE — Telephone Encounter (Signed)
 OK to use the biosimilar Amjevita recommended by insurance  Do I need to represcribe or will they substitute without another Rx?

## 2023-12-06 ENCOUNTER — Encounter: Payer: Self-pay | Admitting: Internal Medicine

## 2023-12-06 MED ORDER — AMJEVITA 80 MG/0.8ML ~~LOC~~ SOAJ: 160.0000 mg | Freq: Once | SUBCUTANEOUS | 0 refills | Status: AC

## 2023-12-06 MED ORDER — AMJEVITA 80 MG/0.8ML ~~LOC~~ SOAJ: 80.0000 mg | Freq: Once | SUBCUTANEOUS | 0 refills | Status: AC

## 2023-12-06 MED ORDER — AMJEVITA 40 MG/0.8ML ~~LOC~~ SOAJ: 40.0000 mg | SUBCUTANEOUS | 11 refills | Status: DC

## 2023-12-06 NOTE — Telephone Encounter (Signed)
 Amjevita  Rx's have been sent to [facility]. I have left a voicemail for patient to call back so I can advise him of this information.

## 2023-12-06 NOTE — Telephone Encounter (Signed)
 PT returning call. Please advise.

## 2023-12-06 NOTE — Telephone Encounter (Signed)
 Patient is advised that insurance has approved Amjevita  rather than Humira. Advised that Amjevita  is still adalimumab  but is a biosimilar. I have also explained that updated prescriptions have been sent to [facility] so he should hear from them shortly regarding that prescription. Also told patient that we can get him set up with a nurse ambassador for Amjevita  to explain how to give himself these injections if he wishes upon medication receival. He verbalizes understanding.

## 2023-12-08 NOTE — Telephone Encounter (Signed)
Fax received from [facility] stating "the prescription indicates 40 mg/0.8 mL is not covered by insurance." Will clarify with PA team.

## 2023-12-12 NOTE — Telephone Encounter (Signed)
Denyse Amass from [employer] called requesting approval for Amjevita to be changed to 2 of 40mL for week 2, stating the insurance is not going to cover it. Thank you.  [phone]

## 2023-12-13 ENCOUNTER — Other Ambulatory Visit (HOSPITAL_COMMUNITY): Payer: Self-pay

## 2023-12-13 MED ORDER — AMJEVITA 40 MG/0.4ML ~~LOC~~ SOAJ: 40.0000 mg | SUBCUTANEOUS | Status: AC

## 2023-12-13 NOTE — Telephone Encounter (Signed)
Spoke to pharmacist at [employer] [facility]. Amjevita 40 mg/0.8 ml dosing is being discontinued by the manufacturer. Rx will instead need to be for Amjevita 40 mg/0.4 ml every 14 day dosing. This has been authorized and updated in our system.

## 2023-12-13 NOTE — Addendum Note (Signed)
Addended by: Richardson Chiquito on: 12/13/2023 01:05 PM   Modules accepted: Orders

## 2023-12-14 ENCOUNTER — Encounter: Payer: Self-pay | Admitting: Internal Medicine

## 2023-12-14 ENCOUNTER — Other Ambulatory Visit: Payer: Self-pay | Admitting: Internal Medicine

## 2023-12-14 ENCOUNTER — Other Ambulatory Visit (INDEPENDENT_AMBULATORY_CARE_PROVIDER_SITE_OTHER): Payer: BC Managed Care – PPO

## 2023-12-14 ENCOUNTER — Ambulatory Visit: Payer: BC Managed Care – PPO | Admitting: Internal Medicine

## 2023-12-14 DIAGNOSIS — K51311 Ulcerative (chronic) rectosigmoiditis with rectal bleeding: Secondary | ICD-10-CM | POA: Diagnosis not present

## 2023-12-14 LAB — HEPATIC FUNCTION PANEL
ALT: 22 U/L (ref 0–53)
AST: 12 U/L (ref 0–37)
Albumin: 4.2 g/dL (ref 3.5–5.2)
Alkaline Phosphatase: 56 U/L (ref 39–117)
Bilirubin, Direct: 0.1 mg/dL (ref 0.0–0.3)
Total Bilirubin: 0.5 mg/dL (ref 0.2–1.2)
Total Protein: 7 g/dL (ref 6.0–8.3)

## 2023-12-14 LAB — CBC WITH DIFFERENTIAL/PLATELET
Basophils Absolute: 0 10*3/uL (ref 0.0–0.1)
Basophils Relative: 0.5 % (ref 0.0–3.0)
Eosinophils Absolute: 0.2 10*3/uL (ref 0.0–0.7)
Eosinophils Relative: 3.5 % (ref 0.0–5.0)
HCT: 38.9 % — ABNORMAL LOW (ref 39.0–52.0)
Hemoglobin: 12.7 g/dL — ABNORMAL LOW (ref 13.0–17.0)
Lymphocytes Relative: 20.4 % (ref 12.0–46.0)
Lymphs Abs: 1.4 10*3/uL (ref 0.7–4.0)
MCHC: 32.8 g/dL (ref 30.0–36.0)
MCV: 80.3 fL (ref 78.0–100.0)
Monocytes Absolute: 0.7 10*3/uL (ref 0.1–1.0)
Monocytes Relative: 9.8 % (ref 3.0–12.0)
Neutro Abs: 4.4 10*3/uL (ref 1.4–7.7)
Neutrophils Relative %: 65.8 % (ref 43.0–77.0)
Platelets: 237 10*3/uL (ref 150.0–400.0)
RBC: 4.84 Mil/uL (ref 4.22–5.81)
RDW: 14.8 % (ref 11.5–15.5)
WBC: 6.8 10*3/uL (ref 4.0–10.5)

## 2023-12-14 NOTE — Telephone Encounter (Signed)
Patient informed and said he just came for his lab work.

## 2023-12-14 NOTE — Telephone Encounter (Signed)
Please advise Sir. The last dose I see said stay at 20mg  for 2 weeks.

## 2023-12-14 NOTE — Telephone Encounter (Signed)
I refilled it please let him know - he may have enough based upon 1/5 My Chart message but he is getting ready to travel so I want him to have plenty

## 2023-12-18 ENCOUNTER — Ambulatory Visit: Payer: BC Managed Care – PPO | Admitting: Internal Medicine

## 2023-12-27 ENCOUNTER — Other Ambulatory Visit: Payer: Self-pay | Admitting: Internal Medicine

## 2023-12-27 NOTE — Telephone Encounter (Signed)
 Sir is it okay to switch to 90 day supply?

## 2024-01-09 ENCOUNTER — Other Ambulatory Visit: Payer: Self-pay | Admitting: Internal Medicine

## 2024-01-09 ENCOUNTER — Encounter: Payer: Self-pay | Admitting: Internal Medicine

## 2024-01-09 ENCOUNTER — Other Ambulatory Visit (INDEPENDENT_AMBULATORY_CARE_PROVIDER_SITE_OTHER): Payer: BC Managed Care – PPO

## 2024-01-09 DIAGNOSIS — K51311 Ulcerative (chronic) rectosigmoiditis with rectal bleeding: Secondary | ICD-10-CM

## 2024-01-09 LAB — HEPATIC FUNCTION PANEL
ALT: 24 U/L (ref 0–53)
AST: 16 U/L (ref 0–37)
Albumin: 3.9 g/dL (ref 3.5–5.2)
Alkaline Phosphatase: 56 U/L (ref 39–117)
Bilirubin, Direct: 0 mg/dL (ref 0.0–0.3)
Total Bilirubin: 0.4 mg/dL (ref 0.2–1.2)
Total Protein: 6.7 g/dL (ref 6.0–8.3)

## 2024-01-09 LAB — CBC
HCT: 35.6 % — ABNORMAL LOW (ref 39.0–52.0)
Hemoglobin: 11.6 g/dL — ABNORMAL LOW (ref 13.0–17.0)
MCHC: 32.5 g/dL (ref 30.0–36.0)
MCV: 79.9 fL (ref 78.0–100.0)
Platelets: 295 10*3/uL (ref 150.0–400.0)
RBC: 4.45 Mil/uL (ref 4.22–5.81)
RDW: 15.1 % (ref 11.5–15.5)
WBC: 6.7 10*3/uL (ref 4.0–10.5)

## 2024-01-31 ENCOUNTER — Encounter: Payer: Self-pay | Admitting: Internal Medicine

## 2024-01-31 ENCOUNTER — Other Ambulatory Visit (INDEPENDENT_AMBULATORY_CARE_PROVIDER_SITE_OTHER)

## 2024-01-31 ENCOUNTER — Ambulatory Visit: Payer: BC Managed Care – PPO | Admitting: Internal Medicine

## 2024-01-31 VITALS — BP 136/70 | HR 88 | Ht 68.0 in | Wt 225.0 lb

## 2024-01-31 DIAGNOSIS — D649 Anemia, unspecified: Secondary | ICD-10-CM | POA: Diagnosis not present

## 2024-01-31 DIAGNOSIS — K51311 Ulcerative (chronic) rectosigmoiditis with rectal bleeding: Secondary | ICD-10-CM

## 2024-01-31 DIAGNOSIS — Z7960 Long term (current) use of unspecified immunomodulators and immunosuppressants: Secondary | ICD-10-CM

## 2024-01-31 LAB — COMPREHENSIVE METABOLIC PANEL
ALT: 19 U/L (ref 0–53)
AST: 11 U/L (ref 0–37)
Albumin: 4.3 g/dL (ref 3.5–5.2)
Alkaline Phosphatase: 56 U/L (ref 39–117)
BUN: 15 mg/dL (ref 6–23)
CO2: 29 meq/L (ref 19–32)
Calcium: 9.5 mg/dL (ref 8.4–10.5)
Chloride: 98 meq/L (ref 96–112)
Creatinine, Ser: 1.18 mg/dL (ref 0.40–1.50)
GFR: 65.57 mL/min (ref >60.00)
Glucose, Bld: 420 mg/dL — ABNORMAL HIGH (ref 70–99)
Potassium: 4.4 meq/L (ref 3.5–5.1)
Sodium: 133 meq/L — ABNORMAL LOW (ref 135–145)
Total Bilirubin: 0.4 mg/dL (ref 0.2–1.2)
Total Protein: 6.9 g/dL (ref 6.0–8.3)

## 2024-01-31 LAB — CBC WITH DIFFERENTIAL/PLATELET
Basophils Absolute: 0 10*3/uL (ref 0.0–0.1)
Basophils Relative: 0.3 % (ref 0.0–3.0)
Eosinophils Absolute: 0 10*3/uL (ref 0.0–0.7)
Eosinophils Relative: 0.1 % (ref 0.0–5.0)
HCT: 37.9 % — ABNORMAL LOW (ref 39.0–52.0)
Hemoglobin: 12.7 g/dL — ABNORMAL LOW (ref 13.0–17.0)
Lymphocytes Relative: 6.2 % — ABNORMAL LOW (ref 12.0–46.0)
Lymphs Abs: 0.4 10*3/uL — ABNORMAL LOW (ref 0.7–4.0)
MCHC: 33.4 g/dL (ref 30.0–36.0)
MCV: 79.4 fl (ref 78.0–100.0)
Monocytes Absolute: 0.2 10*3/uL (ref 0.1–1.0)
Monocytes Relative: 2.6 % — ABNORMAL LOW (ref 3.0–12.0)
Neutro Abs: 6.4 10*3/uL (ref 1.4–7.7)
Neutrophils Relative %: 90.8 % — ABNORMAL HIGH (ref 43.0–77.0)
Platelets: 275 10*3/uL (ref 150.0–400.0)
RBC: 4.78 Mil/uL (ref 4.22–5.81)
RDW: 16.6 % — ABNORMAL HIGH (ref 11.5–15.5)
WBC: 7.1 10*3/uL (ref 4.0–10.5)

## 2024-01-31 MED ORDER — PREDNISONE 5 MG PO TABS: ORAL_TABLET | ORAL | 0 refills | Status: AC

## 2024-01-31 NOTE — Progress Notes (Signed)
 Patient and his wife were instructed on self injection administration.  Patient properly demonstrated aseptic technique using the Amjetiva auto injector and was able to inject the second injection himself. All questions answered.  We discussed the importance of proper needle disposal at home.  They will call back for any additional questions or concerns. Amjectiva injection instructions sent home with patient and his wife.  Barnett Applebaum, RN

## 2024-01-31 NOTE — Progress Notes (Signed)
 Richard Carey 62 y.o. 04/28/1961 [id number]  Assessment & Plan:   Encounter Diagnoses  Name Primary?   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  It is time to initiate his adalimumab.  He brought his loading dose (160 mg) with him today and he was instructed on how to inject by nursing staff.  He will continue this.  Mesalamine is discontinued today  Continue 6-MP   Prednisone taper moving on from 20 mg daily  Meds ordered this encounter  Medications   predniSONE (DELTASONE) 5 MG tablet    Sig: Take 3 tablets (15 mg total) by mouth daily with breakfast for 14 days, THEN 2 tablets (10 mg total) daily with breakfast for 14 days, THEN 1 tablet (5 mg total) daily with breakfast for 14 days.    Dispense:  84 tablet    Refill:  0    Lab follow-up as below Orders Placed This Encounter  Procedures   CBC with Differential/Platelet   Comprehensive metabolic panel   Vitamin D (25 hydroxy)   Ferritin    Once I review labs we will determine timing of follow-up probably about 4 months  I do think it is wise to obtain the shingles vaccine and have recommended he do so.  CC: Richard Rua, MD  Subjective:  Gastroenterology summary:   Left-sided ulcerative colitis:   February 14, 2023-colonoscopy for rectal bleeding, findings suggestive of segmental colitis associated with diverticulosis and hemorrhoids.  Treated with Cipro and metronidazole but had persistent bleeding issues despite treatment for hemorrhoids as well.  Then treated with prednisone in summer 2024 with benefit.   Colonoscopy 08/08/2023 with left-sided ulcerative colitis, biopsies this time show changes of chronic colitis.  Treatment with mesalamine in the form of Lialda 2.4 g daily.  Then in November flared with bleeding and some diarrhea and prednisone taper restarted  after a brief period of time on mesalamine 4.8 g daily.   November 2024-flaring, moved to 4.8 g mesalamine but not helpful and required prednisone which was continued into December and January and February 2025  6-MP 50 mg daily in anticipation of concomitant adalimumab started January 2025   IBD treatment timeline:  Summer 2024 prednisone used, working diagnosis with scad September 2024 mesalamine started 2.4 g Lialda daily then increased to 4.8 g daily but required more prednisone January 2025 6-MP 50 mg daily started and adalimumab prescribed but not started   --------------------------------------------------------------------------------------------- Chief complaint: Follow-up of left-sided ulcerative colitis  HPI The patient is a 63 year old man for follow-up of his ulcerative colitis, summarized as above.  He has traveled quite a bit since I saw him last in January he has managed that he has improved.  Decreased diarrhea no bleeding.  Stools are soft and "flat-ish".  About 4 each day.  When he was traveling he went up to 30 mg daily on the prednisone due to concerns about flaring even though he was at 20 mg on the taper.  He has been on 20 mg daily for about 3 weeks now.  He brought his Amjevita (adalimumab biosimilar) with him today the 160 mg dose (2 x 80 mg) for instructions he has not yet started it.  He has run out of mesalamine in the last few days.  No evident side effects from 6-MP and labs have been acceptable.  These have been reviewed.  Wife  with him today.  Asking if he should get the shingles vaccine  Wt Readings from Last 3 Encounters:  01/31/24 225 lb (102.1 kg)  11/23/23 229 lb 9.6 oz (104.1 kg)  10/26/23 233 lb (105.7 kg)   Lab Results  Component Value Date   WBC 6.7 01/09/2024   HGB 11.6 (L) 01/09/2024   HCT 35.6 (L) 01/09/2024   MCV 79.9 01/09/2024   PLT 295.0 01/09/2024     Lab Results  Component Value Date   ALT 24 01/09/2024   AST 16 01/09/2024    ALKPHOS 56 01/09/2024   BILITOT 0.4 01/09/2024    Allergies  Allergen Reactions   Keflex [Cephalexin] Hives   Current Meds  Medication Sig   Adalimumab-atto (AMJEVITA) 40 MG/0.4ML SOAJ Inject 40 mg into the skin every 14 (fourteen) days.   diphenhydramine-acetaminophen (TYLENOL PM) 25-500 MG TABS tablet Take 1 tablet by mouth at bedtime as needed.   hyoscyamine (LEVBID) 0.375 MG 12 hr tablet TAKE 1 TABLET (0.375 MG TOTAL) BY MOUTH 2 (TWO) TIMES DAILY. (Patient taking differently: Take 0.375 mg by mouth daily as needed.)   mercaptopurine (PURINETHOL) 50 MG tablet TAKE 1 TABLET (50 MG TOTAL) BY MOUTH DAILY. GIVE ON AN EMPTY STOMACH 1 HOUR BEFORE OR 2 HOURS AFTER MEALS. CAUTION: CHEMOTHERAPY.   predniSONE (DELTASONE) 5 MG tablet Take 3 tablets (15 mg total) by mouth daily with breakfast for 14 days, THEN 2 tablets (10 mg total) daily with breakfast for 14 days, THEN 1 tablet (5 mg total) daily with breakfast for 14 days.   [DISCONTINUED] predniSONE (DELTASONE) 10 MG tablet Take 2 tablets (20 mg total) by mouth daily with breakfast.   Past Medical History:  Diagnosis Date   Acute medial meniscus tear of right knee    Allergy    Ankle pain, right    Left sided ulcerative colitis [facility])    Past Surgical History:  Procedure Laterality Date   COLONOSCOPY     KNEE ARTHROSCOPY Right 09/09/2015   Procedure: RIGHT ARTHROSCOPY KNEE;  Surgeon: Gean Birchwood, MD;  Location: [facility];  Service: Orthopedics;  Laterality: Right;   LYMPH NODE BIOPSY     left side neck   MOUTH SURGERY     NASAL SINUS SURGERY     sinus scraped /1991   TONSILLECTOMY     TOOTH EXTRACTION     Social History   Social History Narrative   Patient is married he has 1 daughter (she has Crohn's disease).  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   No alcohol never smoker drinks many cups of tea each day.   family history includes Aneurysm in his paternal  grandfather.   Review of Systems As per HPI  Objective:   Physical Exam BP 136/70   Pulse 88   Ht 5\' 8"  (1.727 m)   Wt 225 lb (102.1 kg)   BMI 34.21 kg/m  NAD Lungs cta Cor NL S1S2 no rmg Abd soft NT

## 2024-01-31 NOTE — Patient Instructions (Addendum)
 Your provider has requested that you go to the basement level for lab work before leaving today. Press "B" on the elevator. The lab is located at the first door on the left as you exit the elevator.  Due to recent changes in healthcare laws, you may see the results of your imaging and laboratory studies on MyChart before your provider has had a chance to review them.  We understand that in some cases there may be results that are confusing or concerning to you. Not all laboratory results come back in the same time frame and the provider may be waiting for multiple results in order to interpret others.  Please give [place] 48 hours in order for your provider to thoroughly review all the results before contacting the office for clarification of your results.   We have sent the following medications to your pharmacy for you to pick up at your convenience: Prednisone   Today you have been instructed how to give yourself the Adalimumab injection.    I appreciate the opportunity to care for you. Stan Head, MD, [facility]

## 2024-02-01 ENCOUNTER — Other Ambulatory Visit: Payer: Self-pay

## 2024-02-01 ENCOUNTER — Ambulatory Visit (INDEPENDENT_AMBULATORY_CARE_PROVIDER_SITE_OTHER)

## 2024-02-01 DIAGNOSIS — D649 Anemia, unspecified: Secondary | ICD-10-CM | POA: Diagnosis not present

## 2024-02-01 LAB — VITAMIN D 25 HYDROXY (VIT D DEFICIENCY, FRACTURES): VITD: 14.01 ng/mL — ABNORMAL LOW (ref 30.00–100.00)

## 2024-02-01 LAB — FERRITIN: Ferritin: 14.1 ng/mL — ABNORMAL LOW (ref 22.0–322.0)

## 2024-02-01 MED ORDER — VITAMIN D (ERGOCALCIFEROL) 1.25 MG (50000 UNIT) PO CAPS: 50000.0000 [IU] | ORAL_CAPSULE | ORAL | 0 refills | Status: DC

## 2024-02-01 MED ORDER — FERROUS SULFATE 325 (65 FE) MG PO TBEC: 325.0000 mg | DELAYED_RELEASE_TABLET | Freq: Every day | ORAL | 1 refills | Status: DC

## 2024-02-07 NOTE — Telephone Encounter (Signed)
 Dr Leone Payor see the question from the pt regarding new meds for diabetes

## 2024-02-08 NOTE — Telephone Encounter (Signed)
 Dr Leone Payor see the pt message and advise if you have any further recommendations.

## 2024-02-15 ENCOUNTER — Encounter: Payer: Self-pay | Admitting: Internal Medicine

## 2024-02-16 ENCOUNTER — Telehealth: Payer: Self-pay

## 2024-02-16 NOTE — Telephone Encounter (Signed)
 See the patient message of 02/16/24  Spoke with the pharmacist of the specialty pharmacy. The shipment for the 2nd dose contained (2) 40mg  pens to equal the 80 mg Day 15 injection.  Patient instructed to take the second 40 mg injection today. On day 29 he will take (1) 40 mg injection which will be shipped to him at a later date.

## 2024-02-16 NOTE — Telephone Encounter (Signed)
 I called him - he was supposed to take 160 mg and then 2 weeks later 80 mg  So he is not dosed correctly.  Need to check about what was shipped  He was given 80 mg x 2 = 160 mg by Lavonna Rua in office at last visit  Please check with [facility] to see if they shipped an 80 mg second dose  He has a 40 mg extra pen he could take today but then he will need an extra 40 mg dose to ake in 2 weeks so he is not short going forward

## 2024-02-26 ENCOUNTER — Encounter: Payer: Self-pay | Admitting: Internal Medicine

## 2024-03-05 ENCOUNTER — Other Ambulatory Visit: Payer: Self-pay | Admitting: Internal Medicine

## 2024-03-15 ENCOUNTER — Other Ambulatory Visit (HOSPITAL_COMMUNITY): Payer: Self-pay

## 2024-03-15 ENCOUNTER — Telehealth: Payer: Self-pay

## 2024-03-15 NOTE — Telephone Encounter (Signed)
 Pharmacy Patient Advocate Encounter  Received notification from [employer] that Prior Authorization for Amjevita  40MG /0.4ML auto-injectors has been APPROVED from 03-15-2024 to 06-07-2024   PA #/Case ID/Reference #: [id number]

## 2024-03-15 NOTE — Telephone Encounter (Signed)
 Noted.

## 2024-03-15 NOTE — Telephone Encounter (Signed)
 Pharmacy Patient Advocate Encounter   Received notification from CoverMyMeds that prior authorization for Amjevita  40MG /0.4ML auto-injectors is required/requested.   Insurance verification completed.   The patient is insured through [facility] .   Per test claim: PA required; PA submitted to above mentioned insurance via CoverMyMeds Key/confirmation #/EOC [id number] Status is pending

## 2024-03-22 ENCOUNTER — Encounter: Payer: Self-pay | Admitting: Internal Medicine

## 2024-03-23 ENCOUNTER — Emergency Department (HOSPITAL_BASED_OUTPATIENT_CLINIC_OR_DEPARTMENT_OTHER)

## 2024-03-23 ENCOUNTER — Emergency Department (HOSPITAL_BASED_OUTPATIENT_CLINIC_OR_DEPARTMENT_OTHER)
Admission: EM | Admit: 2024-03-23 | Discharge: 2024-03-23 | Disposition: A | Discharge status: Home / Self Care | Attending: Emergency Medicine | Admitting: Emergency Medicine

## 2024-03-23 ENCOUNTER — Encounter (HOSPITAL_BASED_OUTPATIENT_CLINIC_OR_DEPARTMENT_OTHER): Payer: Self-pay | Admitting: Emergency Medicine

## 2024-03-23 DIAGNOSIS — E119 Type 2 diabetes mellitus without complications: Secondary | ICD-10-CM | POA: Diagnosis not present

## 2024-03-23 DIAGNOSIS — K6289 Other specified diseases of anus and rectum: Secondary | ICD-10-CM

## 2024-03-23 DIAGNOSIS — E860 Dehydration: Secondary | ICD-10-CM | POA: Diagnosis not present

## 2024-03-23 DIAGNOSIS — R197 Diarrhea, unspecified: Secondary | ICD-10-CM | POA: Diagnosis not present

## 2024-03-23 DIAGNOSIS — R112 Nausea with vomiting, unspecified: Secondary | ICD-10-CM | POA: Insufficient documentation

## 2024-03-23 LAB — URINALYSIS, ROUTINE W REFLEX MICROSCOPIC
Glucose, UA: >=500 mg/dL — AB
Hgb urine dipstick: NEGATIVE
Ketones, ur: 80 mg/dL — AB
Leukocytes,Ua: NEGATIVE
Nitrite: NEGATIVE
Protein, ur: 30 mg/dL — AB
Specific Gravity, Urine: 1.025 (ref 1.005–1.030)
pH: 6 (ref 5.0–8.0)

## 2024-03-23 LAB — CBC WITH DIFFERENTIAL/PLATELET
Abs Immature Granulocytes: 0.02 K/uL (ref 0.00–0.07)
Basophils Absolute: 0 K/uL (ref 0.0–0.1)
Basophils Relative: 0 %
Eosinophils Absolute: 0.1 K/uL (ref 0.0–0.5)
Eosinophils Relative: 1 %
HCT: 48.5 % (ref 39.0–52.0)
Hemoglobin: 15.6 g/dL (ref 13.0–17.0)
Immature Granulocytes: 0 %
Lymphocytes Relative: 17 %
Lymphs Abs: 1.3 K/uL (ref 0.7–4.0)
MCH: 26.7 pg (ref 26.0–34.0)
MCHC: 32.2 g/dL (ref 30.0–36.0)
MCV: 83 fL (ref 80.0–100.0)
Monocytes Absolute: 1.1 K/uL — ABNORMAL HIGH (ref 0.1–1.0)
Monocytes Relative: 14 %
Neutro Abs: 5.4 K/uL (ref 1.7–7.7)
Neutrophils Relative %: 68 %
Platelets: 339 K/uL (ref 150–400)
RBC: 5.84 MIL/uL — ABNORMAL HIGH (ref 4.22–5.81)
RDW: 17 % — ABNORMAL HIGH (ref 11.5–15.5)
WBC: 8 K/uL (ref 4.0–10.5)
nRBC: 0 % (ref 0.0–0.2)

## 2024-03-23 LAB — COMPREHENSIVE METABOLIC PANEL WITH GFR
ALT: 25 U/L (ref 0–44)
AST: 18 U/L (ref 15–41)
Albumin: 4.8 g/dL (ref 3.5–5.0)
Alkaline Phosphatase: 73 U/L (ref 38–126)
Anion gap: 17 — ABNORMAL HIGH (ref 5–15)
BUN: 33 mg/dL — ABNORMAL HIGH (ref 8–23)
CO2: 20 mmol/L — ABNORMAL LOW (ref 22–32)
Calcium: 10 mg/dL (ref 8.9–10.3)
Chloride: 101 mmol/L (ref 98–111)
Creatinine, Ser: 1.43 mg/dL — ABNORMAL HIGH (ref 0.61–1.24)
GFR, Estimated: 55 mL/min — ABNORMAL LOW (ref >60)
Glucose, Bld: 141 mg/dL — ABNORMAL HIGH (ref 70–99)
Potassium: 3.6 mmol/L (ref 3.5–5.1)
Sodium: 138 mmol/L (ref 135–145)
Total Bilirubin: 0.6 mg/dL (ref 0.0–1.2)
Total Protein: 8.2 g/dL — ABNORMAL HIGH (ref 6.5–8.1)

## 2024-03-23 LAB — URINALYSIS, MICROSCOPIC (REFLEX)

## 2024-03-23 LAB — C DIFFICILE QUICK SCREEN W PCR REFLEX
C Diff antigen: NEGATIVE
C Diff interpretation: NOT DETECTED
C Diff toxin: NEGATIVE

## 2024-03-23 LAB — LIPASE, BLOOD: Lipase: 29 U/L (ref 11–51)

## 2024-03-23 MED ORDER — LOPERAMIDE HCL 2 MG PO CAPS: 2.0000 mg | ORAL_CAPSULE | Freq: Two times a day (BID) | ORAL | 0 refills | Status: DC | PRN

## 2024-03-23 MED ORDER — SODIUM CHLORIDE 0.9 % IV BOLUS
1000.0000 mL | Freq: Once | INTRAVENOUS | Status: AC
  Administered 2024-03-23: 1000 mL via INTRAVENOUS

## 2024-03-23 MED ORDER — ONDANSETRON HCL 4 MG/2ML IJ SOLN
4.0000 mg | Freq: Once | INTRAMUSCULAR | Status: AC
Start: 2024-03-23 — End: 2024-03-23
  Administered 2024-03-23: 4 mg via INTRAVENOUS
  Filled 2024-03-23: qty 2

## 2024-03-23 MED ORDER — LACTATED RINGERS IV BOLUS
1000.0000 mL | Freq: Once | INTRAVENOUS | Status: AC
Start: 2024-03-23 — End: 2024-03-23
  Administered 2024-03-23: 1000 mL via INTRAVENOUS

## 2024-03-23 MED ORDER — ONDANSETRON HCL 4 MG PO TABS: 4.0000 mg | ORAL_TABLET | Freq: Four times a day (QID) | ORAL | 0 refills | Status: DC | PRN

## 2024-03-23 MED ORDER — IOHEXOL 300 MG/ML  SOLN
100.0000 mL | Freq: Once | INTRAMUSCULAR | Status: AC | PRN
  Administered 2024-03-23: 100 mL via INTRAVENOUS

## 2024-03-23 NOTE — ED Triage Notes (Signed)
 NVD since Thurs; hx of ulcerative colitis; denies abd pain; c/o back pain "from puking"

## 2024-03-23 NOTE — Discharge Instructions (Addendum)
 We will call you after your results from your stool culture and PCR is complete.  We will speak with you over the phone and send antibiotics if necessary.  Your GI team will call you early next week to establish follow-up appointment.  In the meantime Zofran  has been sent to your pharmacy to help with nausea.  Imodium has been sent to the pharmacy help with diarrhea.  Please stay hydrated and take Pedialyte if diarrhea continues.

## 2024-03-23 NOTE — ED Notes (Signed)
Pt returned from CT.  No needs identified at this time.

## 2024-03-23 NOTE — ED Notes (Signed)
 Patient transported to CT

## 2024-03-23 NOTE — ED Provider Notes (Signed)
 7:09 PM Patient signed out to me by previous ED physician. Pt is a 63 yo male with pmh of UC presenting for n/v/d.    AKI present likely secondary to dehydration. IVF given. Consider 2nd liter after re-eval.   Physical Exam  BP 126/87   Pulse 75   Temp 98.3 F (36.8 C) (Oral)   Resp 18   Ht 5\' 8"  (1.727 m)   Wt 102.1 kg   SpO2 95%   BMI 34.22 kg/m   Physical Exam  Procedures  Procedures  ED Course / MDM   Clinical Course as of 03/23/24 1909  Sat Mar 23, 2024  1515 CBC normal.  CME with increased creatinine.  Lipase normal [JK]    Clinical Course User Index [JK] Trish Furl, MD   Medical Decision Making Amount and/or Complexity of Data Reviewed Labs: ordered. Radiology: ordered.  Risk Prescription drug management.   After discussion with the patient he still feeling dehydrated.  A second liter of IV fluids was given.  His electrolytes are stable.  His abdomen is soft and nontender.  He does state that this does not feel like his normal ulcerative colitis flare.  He states he does not usually have profuse watery diarrhea and vomiting with it.  He is currently on immunosuppressive therapy.  CT abdomen pelvis ordered and demonstrated proctitis.  I spoke with [place] GI team who recommends C. difficile and GI panel to rule out infectious etiology.  They do not recommend steroid taper at this time due to symptoms being unlike his normal ulcerative colitis flares.  They recommend supportive therapy with Zofran  and Imodium.  They recommend Pedialyte.  They will establish close follow-up.  Currently he remains afebrile with no leukocytosis.  Abdomen soft and nontender. Was able to give a stool sample prior to discharge.  He understands the results will take up to 24 hours we will call him for any abnormal results and/or prescriptions we need to call him.  He understands that if he does not hear from the [facility] GI office that he should call to establish the appointment.  Patient  in no distress and overall condition improved here in the ED. Detailed discussions were had with the patient regarding current findings, and need for close f/u with PCP or on call doctor. The patient has been instructed to return immediately if the symptoms worsen in any way for re-evaluation. Patient verbalized understanding and is in agreement with current care plan. All questions answered prior to discharge.        Quinn Bucco, DO 03/23/24 1909

## 2024-03-23 NOTE — ED Provider Notes (Signed)
 [place] EMERGENCY DEPARTMENT AT [facility] [place] Provider Note   CSN: [id number] Arrival date & time: 03/23/24  1343     History  Chief Complaint  Patient presents with  . Emesis  . Diarrhea    Richard Carey is a 63 y.o. male.   Emesis Associated symptoms: diarrhea   Diarrhea Associated symptoms: vomiting      Patient has a history of ulcerative colitis diabetes.  He started having trouble with nausea vomiting and diarrhea since Thursday.  Patient states he started to feel little better yesterday but then began having symptoms again today.  Patient states he has had a few episodes of vomiting and diarrhea each day.  Less than 5 episodes per day.  He has not noticed any blood in his stool or his emesis.  He is not having abdominal pain.  Patient states he is having some discomfort in his lower back but he thinks that is more from the violent retching and pulling a muscle.  He does feel dehydratedWith dry mouth now  Home Medications Prior to Admission medications   Medication Sig Start Date End Date Taking? Authorizing Provider  Adalimumab -atto (AMJEVITA ) 40 MG/0.4ML SOAJ Inject 40 mg into the skin every 14 (fourteen) days. 12/13/23   Kenney Peacemaker, MD  diphenhydramine-acetaminophen  (TYLENOL  PM) 25-500 MG TABS tablet Take 1 tablet by mouth at bedtime as needed.    [provider]  ferrous sulfate  325 (65 FE) MG EC tablet Take 1 tablet (325 mg total) by mouth daily with breakfast. 02/01/24   Kenney Peacemaker, MD  hyoscyamine  (LEVBID) 0.375 MG 12 hr tablet Take 1 tablet (0.375 mg total) by mouth daily as needed. 03/05/24   Kenney Peacemaker, MD  mercaptopurine  (PURINETHOL ) 50 MG tablet TAKE 1 TABLET (50 MG TOTAL) BY MOUTH DAILY. GIVE ON AN EMPTY STOMACH 1 HOUR BEFORE OR 2 HOURS AFTER MEALS. CAUTION: CHEMOTHERAPY. 12/27/23   Kenney Peacemaker, MD  Vitamin D , Ergocalciferol , (DRISDOL ) 1.25 MG (50000 UNIT) CAPS capsule Take 1 capsule (50,000 Units total) by mouth every 7 (seven)  days. 02/01/24   Kenney Peacemaker, MD      Allergies    Keflex [cephalexin] and Tussin dm [guaifenesin-dm]    Review of Systems   Review of Systems  Gastrointestinal:  Positive for diarrhea and vomiting.    Physical Exam Updated Vital Signs BP 111/83   Pulse 94   Temp 98.1 F (36.7 C) (Oral)   Resp 18   Ht 1.727 m (5\' 8" )   Wt 102.1 kg   SpO2 98%   BMI 34.22 kg/m  Physical Exam Vitals and nursing note reviewed.  Constitutional:      General: He is not in acute distress.    Appearance: He is well-developed.  HENT:     Head: Normocephalic and atraumatic.     Right Ear: External ear normal.     Left Ear: External ear normal.  Eyes:     General: No scleral icterus.       Right eye: No discharge.        Left eye: No discharge.     Conjunctiva/sclera: Conjunctivae normal.  Neck:     Trachea: No tracheal deviation.  Cardiovascular:     Rate and Rhythm: Normal rate and regular rhythm.  Pulmonary:     Effort: Pulmonary effort is normal. No respiratory distress.     Breath sounds: Normal breath sounds. No stridor. No wheezing or rales.  Abdominal:  General: Bowel sounds are normal. There is no distension.     Palpations: Abdomen is soft.     Tenderness: There is no abdominal tenderness. There is no guarding or rebound.  Musculoskeletal:        General: No tenderness or deformity.     Cervical back: Neck supple.  Skin:    General: Skin is warm and dry.     Findings: No rash.  Neurological:     General: No focal deficit present.     Mental Status: He is alert.     Cranial Nerves: No cranial nerve deficit, dysarthria or facial asymmetry.     Sensory: No sensory deficit.     Motor: No abnormal muscle tone or seizure activity.     Coordination: Coordination normal.  Psychiatric:        Mood and Affect: Mood normal.     ED Results / Procedures / Treatments   Labs (all labs ordered are listed, but only abnormal results are displayed) Labs Reviewed  COMPREHENSIVE  METABOLIC PANEL WITH GFR - Abnormal; Notable for the following components:      Result Value   CO2 20 (*)    Glucose, Bld 141 (*)    BUN 33 (*)    Creatinine, Ser 1.43 (*)    Total Protein 8.2 (*)    GFR, Estimated 55 (*)    Anion gap 17 (*)    All other components within normal limits  CBC WITH DIFFERENTIAL/PLATELET - Abnormal; Notable for the following components:   RBC 5.84 (*)    RDW 17.0 (*)    Monocytes Absolute 1.1 (*)    All other components within normal limits  LIPASE, BLOOD  URINALYSIS, ROUTINE W REFLEX MICROSCOPIC    EKG None  Radiology No results found.  Procedures Procedures    Medications Ordered in ED Medications  lactated ringers  bolus 1,000 mL (1,000 mLs Intravenous New Bag/Given 03/23/24 1513)  ondansetron  (ZOFRAN ) injection 4 mg (4 mg Intravenous Given 03/23/24 1513)    ED Course/ Medical Decision Making/ A&P Clinical Course as of 03/23/24 1516  Sat Mar 23, 2024  1515 CBC normal.  CME with increased creatinine.  Lipase normal [JK]    Clinical Course User Index [JK] Trish Furl, MD                                 Medical Decision Making Problems Addressed: Dehydration: acute illness or injury that poses a threat to life or bodily functions Nausea vomiting and diarrhea: acute illness or injury that poses a threat to life or bodily functions  Amount and/or Complexity of Data Reviewed Labs: ordered. Decision-making details documented in ED Course.  Risk Prescription drug management.   Pt with vomiting and diarrhea.  No abd pain.  Hx of UC. No fever, or abd pain.  No blood in stool.  Lower suspicion for UC flare, colitis, diverticulitis.  Will plan on IV fluids, antiemetics.  Reassess.  Care turned over to Dr Martina Sledge at shift change.         Final Clinical Impression(s) / ED Diagnoses Final diagnoses:  Nausea vomiting and diarrhea  Dehydration    Rx / DC Orders ED Discharge Orders     None         Trish Furl, MD 03/23/24 [phone]

## 2024-03-25 ENCOUNTER — Emergency Department (HOSPITAL_COMMUNITY)
Admission: EM | Admit: 2024-03-25 | Discharge: 2024-03-25 | Disposition: A | Discharge status: Home / Self Care | Attending: Emergency Medicine | Admitting: Emergency Medicine

## 2024-03-25 ENCOUNTER — Telehealth: Payer: Self-pay

## 2024-03-25 DIAGNOSIS — R112 Nausea with vomiting, unspecified: Secondary | ICD-10-CM | POA: Insufficient documentation

## 2024-03-25 DIAGNOSIS — R197 Diarrhea, unspecified: Secondary | ICD-10-CM | POA: Insufficient documentation

## 2024-03-25 LAB — CBC WITH DIFFERENTIAL/PLATELET
Abs Immature Granulocytes: 0.02 10*3/uL (ref 0.00–0.07)
Basophils Absolute: 0 10*3/uL (ref 0.0–0.1)
Basophils Relative: 0 %
Eosinophils Absolute: 0.2 10*3/uL (ref 0.0–0.5)
Eosinophils Relative: 3 %
HCT: 48.1 % (ref 39.0–52.0)
Hemoglobin: 15.2 g/dL (ref 13.0–17.0)
Immature Granulocytes: 0 %
Lymphocytes Relative: 15 %
Lymphs Abs: 0.9 10*3/uL (ref 0.7–4.0)
MCH: 26.9 pg (ref 26.0–34.0)
MCHC: 31.6 g/dL (ref 30.0–36.0)
MCV: 85 fL (ref 80.0–100.0)
Monocytes Absolute: 0.9 10*3/uL (ref 0.1–1.0)
Monocytes Relative: 16 %
Neutro Abs: 4 10*3/uL (ref 1.7–7.7)
Neutrophils Relative %: 66 %
Platelets: 272 10*3/uL (ref 150–400)
RBC: 5.66 MIL/uL (ref 4.22–5.81)
RDW: 16.6 % — ABNORMAL HIGH (ref 11.5–15.5)
WBC: 6 10*3/uL (ref 4.0–10.5)
nRBC: 0 % (ref 0.0–0.2)

## 2024-03-25 LAB — COMPREHENSIVE METABOLIC PANEL WITH GFR
ALT: 19 U/L (ref 0–44)
AST: 19 U/L (ref 15–41)
Albumin: 4.2 g/dL (ref 3.5–5.0)
Alkaline Phosphatase: 51 U/L (ref 38–126)
Anion gap: 8 (ref 5–15)
BUN: 16 mg/dL (ref 8–23)
CO2: 24 mmol/L (ref 22–32)
Calcium: 9.3 mg/dL (ref 8.9–10.3)
Chloride: 107 mmol/L (ref 98–111)
Creatinine, Ser: 0.99 mg/dL (ref 0.61–1.24)
GFR, Estimated: >60 mL/min (ref >60)
Glucose, Bld: 115 mg/dL — ABNORMAL HIGH (ref 70–99)
Potassium: 3.6 mmol/L (ref 3.5–5.1)
Sodium: 139 mmol/L (ref 135–145)
Total Bilirubin: 0.9 mg/dL (ref 0.0–1.2)
Total Protein: 7.3 g/dL (ref 6.5–8.1)

## 2024-03-25 LAB — GASTROINTESTINAL PANEL BY PCR, STOOL (REPLACES STOOL CULTURE)

## 2024-03-25 LAB — LIPASE, BLOOD: Lipase: 30 U/L (ref 11–51)

## 2024-03-25 LAB — MAGNESIUM: Magnesium: 1.9 mg/dL (ref 1.7–2.4)

## 2024-03-25 MED ORDER — SODIUM CHLORIDE 0.9 % IV BOLUS
1000.0000 mL | Freq: Once | INTRAVENOUS | Status: AC
  Administered 2024-03-25: 1000 mL via INTRAVENOUS

## 2024-03-25 NOTE — Telephone Encounter (Signed)
 Inbound call from patient requesting a call from [place]. Please advise, thank you.

## 2024-03-25 NOTE — Telephone Encounter (Signed)
 Patient reports he is not better. He has been up multiple times through the night due to diarrhea despite Imodium. He has been taking Zofran  due to extreme nausea. Has not vomited in 24 hours. Diarrhea is "brown soup" and does not distinguish any blood in the stool. Patient states he feels very bad and wonders if he needs to be admitted. Completed last prednisone  taper 2 weeks ago. Took his Amjevita  on 03/20/24. Symptoms began on 03/21/24.

## 2024-03-25 NOTE — ED Triage Notes (Signed)
 Pt returns to the ED with continued complaints of diarrhea and nausea , was seen at [facility] 2 days ago for same , his GI Md told him to come back to the Ed for possible admission

## 2024-03-25 NOTE — Telephone Encounter (Signed)
 Patient agrees to go to the ED for re-evaluation. He is unimproved and continues to have diarrhea.

## 2024-03-25 NOTE — Telephone Encounter (Signed)
 Dr Bridgett Camps asks that the patient be contacted for a symptom update. Patient seen in the ED over the weekend for diarrhea and abdominal pain. Called the patient's home and cell numbers. No answer. Left a message on the cell for asking for a return call.

## 2024-03-25 NOTE — ED Provider Notes (Signed)
 [place] EMERGENCY DEPARTMENT AT [facility] Provider Note   CSN: [id number] Arrival date & time: 03/25/24  1602     History  Chief Complaint  Patient presents with   Diarrhea   Nausea    Richard Carey is a 63 y.o. 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  Patient states he had his infusion of Amjevita  last Wednesday and then Thursday began noticing symptoms.  Patient states he cannot stop having loose bowels.  Patient had negative GI panel along with C. difficile and was seen at the ED already for this and had negative workup.  Since then patient been able to tolerate p.o. as the vomiting is stopped but he still having loose stools.  Patient contacted his GI specialist in which they recommend he comes in to get repeat labs.  Patient states that he is not having any blood in his stool and denies any abdominal pain fevers chest pain recent travel or hospitalization or antibiotics.  Home Medications Prior to Admission medications   Medication Sig Start Date End Date Taking? Authorizing Provider  Adalimumab -atto (AMJEVITA ) 40 MG/0.4ML SOAJ Inject 40 mg into the skin every 14 (fourteen) days. 12/13/23   Kenney Peacemaker, MD  diphenhydramine-acetaminophen  (TYLENOL  PM) 25-500 MG TABS tablet Take 1 tablet by mouth at bedtime as needed.    [provider]  ferrous sulfate  325 (65 FE) MG EC tablet Take 1 tablet (325 mg total) by mouth daily with breakfast. 02/01/24   Kenney Peacemaker, MD  hyoscyamine  (LEVBID) 0.375 MG 12 hr tablet Take 1 tablet (0.375 mg total) by mouth daily as needed. 03/05/24   Kenney Peacemaker, MD  loperamide  (IMODIUM ) 2 MG capsule Take 1 capsule (2 mg total) by mouth every 12 (twelve) hours as needed for diarrhea or loose stools. 03/23/24   Owen Blowers P, DO  mercaptopurine  (PURINETHOL ) 50 MG tablet TAKE 1 TABLET (50 MG TOTAL) BY MOUTH DAILY. GIVE ON AN EMPTY STOMACH 1 HOUR  BEFORE OR 2 HOURS AFTER MEALS. CAUTION: CHEMOTHERAPY. 12/27/23   Kenney Peacemaker, MD  ondansetron  (ZOFRAN ) 4 MG tablet Take 1 tablet (4 mg total) by mouth every 6 (six) hours as needed for nausea or vomiting. 03/23/24   Owen Blowers P, DO  Vitamin D , Ergocalciferol , (DRISDOL ) 1.25 MG (50000 UNIT) CAPS capsule Take 1 capsule (50,000 Units total) by mouth every 7 (seven) days. 02/01/24   Kenney Peacemaker, MD      Allergies    Keflex [cephalexin] and Tussin dm [guaifenesin-dm]    Review of Systems   Review of Systems  Gastrointestinal:  Positive for diarrhea.    Physical Exam Updated Vital Signs BP 115/81 (BP Location: Right Arm)   Pulse 85   Temp 98.7 F (37.1 C)   Resp 16   SpO2 96%  Physical Exam Vitals reviewed.  Constitutional:      General: He is not in acute distress. HENT:     Head: Normocephalic and atraumatic.     Mouth/Throat:     Mouth: Mucous membranes are moist.  Eyes:     Extraocular Movements: Extraocular movements intact.     Conjunctiva/sclera: Conjunctivae normal.     Pupils: Pupils are equal, round, and reactive to light.  Cardiovascular:     Rate and Rhythm: Normal rate and regular rhythm.     Pulses: Normal pulses.     Heart sounds: Normal heart sounds.     Comments: 2+ bilateral  radial/dorsalis pedis pulses with regular rate Pulmonary:     Effort: Pulmonary effort is normal. No respiratory distress.     Breath sounds: Normal breath sounds.  Abdominal:     Palpations: Abdomen is soft.     Tenderness: There is no abdominal tenderness. There is no guarding or rebound.  Musculoskeletal:        General: Normal range of motion.     Cervical back: Normal range of motion and neck supple.     Comments: 5 out of 5 bilateral grip/leg extension strength  Skin:    General: Skin is warm and dry.     Capillary Refill: Capillary refill takes less than 2 seconds.  Neurological:     General: No focal deficit present.     Mental Status: He is alert and oriented to  person, place, and time.     Comments: Sensation intact in all 4 limbs  Psychiatric:        Mood and Affect: Mood normal.     ED Results / Procedures / Treatments   Labs (all labs ordered are listed, but only abnormal results are displayed) Labs Reviewed  CBC WITH DIFFERENTIAL/PLATELET - Abnormal; Notable for the following components:      Result Value   RDW 16.6 (*)    All other components within normal limits  COMPREHENSIVE METABOLIC PANEL WITH GFR - Abnormal; Notable for the following components:   Glucose, Bld 115 (*)    All other components within normal limits  LIPASE, BLOOD  MAGNESIUM  URINALYSIS, ROUTINE W REFLEX MICROSCOPIC    EKG None  Radiology No results found.  Procedures Procedures    Medications Ordered in ED Medications  sodium chloride  0.9 % bolus 1,000 mL (0 mLs Intravenous Stopped 03/25/24 2047)    ED Course/ Medical Decision Making/ A&P                                 Medical Decision Making Amount and/or Complexity of Data Reviewed Labs: ordered.   Lorin Room 63 y.o. presented today for diarrhea.  Working DDx that I considered at this time includes, but not limited to, electrolyte abnormalities, dehydration, Campylobacter, Salmonella, EHEC, Shigella, Vibrio, Yersinia, IBD, Ischemic Colitis, Viral AGE, E. Coli, C. Diff, Nora/Rotavirus, Giardia, Listeria, Clostridium, IBD, IBS.  R/o DDx: electrolyte abnormalities, dehydration, Campylobacter, Salmonella, EHEC, Shigella, Vibrio, Yersinia, IBD, Ischemic Colitis, Viral AGE, E. Coli, C. Diff, Nora/Rotavirus, Giardia, Listeria, Clostridium, IBD, IBS: These are considered less likely due to history of present illness, physical exam, lab/imaging findings  Review of prior external notes: 5-25 unknown  Unique Tests and My Independent Interpretation:  CBC: Unremarkable CMP: Unremarkable Lipase: Unremarkable Magnesium: Unremarkable  Social Determinants of Health: none  Discussion with Independent  Historian:  Wife  Discussion of Management of Tests: None  Risk: Medium: prescription drug management  Risk Stratification Score: None  Plan: On exam patient was in no acute distress with stable vitals.  Patient exam is ultimately unremarkable and reassuring as he has moist mucosal membranes and is not pale appearing.  Upon chart review patient did speak with his GI specialist who recommends coming in to get his labs rechecked as they are worried about his creatinine.  Will give fluids here and check labs.  Patient had negative GI panel along with C. difficile test from previous visit along with negative CT imaging.  Patient was successfully PO challenged.  Patient's labs are ultimately unremarkable  and have improved since last visit.  I did recommend patient follows up closely with her GI specialist and uses Metamucil along with bananas, rice, apples, toast diet and drink plenty fluids and to continue using the Zofran  along with Imodium  to which patient agreed.  Patient was given return precautions. Patient stable for discharge at this time.  Patient verbalized understanding of plan.  This chart was dictated using voice recognition software.  Despite best efforts to proofread,  errors can occur which can change the documentation meaning.        Final Clinical Impression(s) / ED Diagnoses Final diagnoses:  Diarrhea, unspecified type    Rx / DC Orders ED Discharge Orders     None         Elex Grimmer 03/25/24 2151    Sallyanne Creamer, DO 03/29/24 [phone]

## 2024-03-25 NOTE — Discharge Instructions (Addendum)
 Diet:  Begin with a bland diet, including foods such as bananas, rice, applesauce, and toast (BRAT diet).  Avoid dairy products, fatty foods, high-fiber foods, and caffeine until symptoms improve.  Maintain hydration with oral rehydration solutions (ORS) or clear fluids like water, broth, and oral rehydration salts. Sports drinks can be used but are not sufficient alone for severe dehydration.  Medications:  Over-the-counter loperamide (Imodium) can be used to reduce stool frequency in non-bloody diarrhea.  Bismuth subsalicylate (Pepto-Bismol) may help control stool passage and provide symptomatic relief.  May use Metamucil to help form stools.  Probiotics may be considered to shorten the duration of symptoms.  Red Flag Symptoms:  Seek immediate medical attention if any of the following occur:  Persistent high fever (>38.5C or 101.6F)  Bloody or mucoid stools  Signs of severe dehydration (e.g., dry mouth, decreased urination, dizziness)  Severe abdominal pain  Symptoms persisting beyond 48 hours despite treatment  Follow-Up:  Schedule a follow-up appointment with a gastroenterologist if symptoms persist beyond 48 hours or if there are recurrent episodes of diarrhea.

## 2024-03-26 NOTE — Telephone Encounter (Addendum)
 FYI Spoke with the patient. He did go to the ER as advised. Labs were improved. He is not having any nausea, but does not have appetite. Afebrile. Last took Imodium at 7:00 am and has not had any diarrhea since then. He will continue to focus on hydration. Take Imodium as directed. Appointment for follow up 03/29/24 at 1010 am with Dr Willy Harvest.

## 2024-03-26 NOTE — Telephone Encounter (Signed)
 PT called to schedule ED FU . He said he is very sick and wants to be seen today. He was given 6/19 with an APP and he refused appointment and hung up. He said he is very sick and wants to be seen today.

## 2024-03-29 ENCOUNTER — Encounter: Payer: Self-pay | Admitting: Internal Medicine

## 2024-03-29 ENCOUNTER — Ambulatory Visit (INDEPENDENT_AMBULATORY_CARE_PROVIDER_SITE_OTHER): Admitting: Internal Medicine

## 2024-03-29 VITALS — BP 122/70 | HR 80 | Ht 68.0 in | Wt 210.0 lb

## 2024-03-29 DIAGNOSIS — Z7960 Long term (current) use of unspecified immunomodulators and immunosuppressants: Secondary | ICD-10-CM | POA: Diagnosis not present

## 2024-03-29 DIAGNOSIS — K5130 Ulcerative (chronic) rectosigmoiditis without complications: Secondary | ICD-10-CM

## 2024-03-29 DIAGNOSIS — R112 Nausea with vomiting, unspecified: Secondary | ICD-10-CM

## 2024-03-29 DIAGNOSIS — R197 Diarrhea, unspecified: Secondary | ICD-10-CM | POA: Diagnosis not present

## 2024-03-29 NOTE — Progress Notes (Signed)
 Richard Carey 62 y.o. 1961-10-12 [id number]  Assessment & Plan:   Encounter Diagnoses  Name Primary?   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 difficile and GI pathogen panel were negative.  CT scan was unrevealing.  I do not think this is a flare of his IBD.  The temporal association with injection of Amjevita  is noted though I do not think he had a reaction to that, though cannot rule it out at this point.  The plan is to try to advance diet and to resume his medication and observe.  He will see me in follow-up in late July.  He will contact me if he fails to continue to improve.  I think it is going to be appropriate to take his Amjevita  injection that is due next week but if he has not improved adequately (we discussed) he will contact me about that.      Subjective:  Gastroenterology summary:   Left-sided ulcerative colitis:   February 14, 2023-colonoscopy for rectal bleeding, findings suggestive of segmental colitis associated with diverticulosis and hemorrhoids.  Treated with Cipro  and metronidazole  but had persistent bleeding issues despite treatment for hemorrhoids as well.  Then treated with prednisone  in summer 2024 with benefit.   Colonoscopy 08/08/2023 with left-sided ulcerative colitis, biopsies this time show changes of chronic colitis.  Treatment with mesalamine  in the form of Lialda  2.4 g daily.  Then in November flared with bleeding and some diarrhea and prednisone  taper restarted after a brief period of time on mesalamine  4.8 g daily.   November 2024-flaring, moved to 4.8 g mesalamine  but not helpful and required prednisone  which was continued into December and January and February 2025  6-MP 50 mg daily in  anticipation of concomitant adalimumab  started January 2025     IBD treatment timeline:   Summer 2024 prednisone  used, working diagnosis with scad September 2024 mesalamine  started 2.4 g Lialda  daily then increased to 4.8 g daily but required more prednisone  January 2025 6-MP 50 mg daily started and adalimumab  prescribed but not started January 31, 2024 adalimumab  begun prednisone  tapering   Chief Complaint: Nausea vomiting and diarrhea, severe for follow-up  HPI 63 year old man with ulcerative proctosigmoiditis being treated with Amgevita and 6-MP, type 2 diabetes mellitus that began after long use of steroids for his IBD, here for follow-up of nausea vomiting and diarrhea that started last week the day after his Amjevita  injection.   He began experiencing symptoms a week ago on Thursday, starting with sulfurous burps and a general feeling of malaise, which led to vomiting. Severe vomiting and diarrhea began on Saturday, with vomiting ceasing early Sunday morning and diarrhea continuing until Wednesday.  He visited the ER on Saturday due to concerns about dehydration and kidney function. A CT scan and various tests were conducted, all returning negative for infections such as norovirus and impaction. His creatinine levels showed improvement by Monday, indicating recovery from dehydration.  He has been taking Zofran  regularly since early Sunday morning, which has helped control the vomiting. He also took Imodium  for diarrhea, but it was initially ineffective. He has not vomited since early Sunday morning and has not had a bowel movement in two days, though he experienced a 'pure salt' fart in the shower.  He reports extreme weakness, occasional abdominal cramping, and lower back pain, likely from the physical strain of vomiting. No muscle aches beyond those related to heaving. His appetite has been poor, but he managed to eat a couple of bananas, Pop-Tarts, and wheat thins yesterday.  He is  currently off most medications except for Amjevita , an injection he took on Wednesday evening before symptoms began. He questions whether his immunosuppressive medications, including 6-MP, could have prolonged his illness. No fever, and his white blood cell count was normal during the ER visit.  He describes his gastrointestinal symptoms as severe, with 'thunderous' rumbling and a sensation of his bowels 'laughing' at him. He found it difficult to sleep due to the gurgling and movement of his bowels when changing positions.    Wt Readings from Last 3 Encounters:  03/29/24 210 lb (95.3 kg)  03/23/24 225 lb 1.4 oz (102.1 kg)  01/31/24 225 lb (102.1 kg)      Latest Ref Rng & Units 03/25/2024    7:15 PM 03/23/2024    1:55 PM 01/31/2024    3:26 PM  CBC  WBC 4.0 - 10.5 K/uL 6.0  8.0  7.1   Hemoglobin 13.0 - 17.0 g/dL 95.6  21.3  08.6   Hematocrit 39.0 - 52.0 % 48.1  48.5  37.9   Platelets 150 - 400 K/uL 272  339  275.0    Lab Results  Component Value Date   ALT 19 03/25/2024   AST 19 03/25/2024   ALKPHOS 51 03/25/2024   BILITOT 0.9 03/25/2024   Lab Results  Component Value Date   NA 139 03/25/2024   CL 107 03/25/2024   K 3.6 03/25/2024   CO2 24 03/25/2024   BUN 16 03/25/2024   CREATININE 0.99 03/25/2024   GFRNONAA >60 03/25/2024   CALCIUM 9.3 03/25/2024   ALBUMIN 4.2 03/25/2024   GLUCOSE 115 (H) 03/25/2024   Lab Results  Component Value Date   LIPASE 30 03/25/2024     Allergies  Allergen Reactions   Keflex [Cephalexin] Hives   Tussin Dm [Guaifenesin-Dm]    Current Meds  Medication Sig   Adalimumab -atto (AMJEVITA ) 40 MG/0.4ML SOAJ Inject 40 mg into the skin every 14 (fourteen) days.   loperamide  (IMODIUM ) 2 MG capsule Take 1 capsule (2 mg total) by mouth every 12 (twelve) hours as needed for diarrhea or loose stools.   ondansetron  (ZOFRAN ) 4 MG tablet Take 1 tablet (4 mg total) by mouth every 6 (six) hours as needed for nausea or vomiting.   Past Medical History:   Diagnosis Date   Acute medial meniscus tear of right knee    Allergy    Ankle pain, right    Left sided ulcerative colitis [facility])    Past Surgical History:  Procedure Laterality Date   COLONOSCOPY     KNEE ARTHROSCOPY Right 09/09/2015   Procedure: RIGHT ARTHROSCOPY KNEE;  Surgeon: Wendolyn Hamburger, MD;  Location: [facility];  Service: Orthopedics;  Laterality: Right;   LYMPH NODE BIOPSY     left side neck   MOUTH SURGERY     NASAL SINUS SURGERY     sinus scraped /1991   TONSILLECTOMY     TOOTH EXTRACTION     Social History   Social History Narrative   Patient is married he has 1 daughter (she has Crohn's disease).  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   No alcohol never smoker drinks many cups of tea  each day.   family history includes Aneurysm in his paternal grandfather.   Review of Systems As per HPI  Objective:   Physical Exam @Pulse  80   Ht 5\' 8"  (1.727 m)   Wt 210 lb (95.3 kg)   BMI 31.93 kg/m @  General:  NAD Eyes:   anicteric Lungs:  clear Heart::  S1S2 no rubs, murmurs or gallops Abdomen:  soft and nontender, BS+ Ext:   no edema, cyanosis or clubbing    Data Reviewed:  Most recent ER visits including labs and imaging.

## 2024-03-29 NOTE — Patient Instructions (Addendum)
 Gradually advance your diet and resume your medicines in the next couple of days.   Message us  with any questions before your next Amjevita  injection.  _______________________________________________________  If your blood pressure at your visit was 140/90 or greater, please contact your primary care physician to follow up on this.  _______________________________________________________  If you are age 64 or older, your body mass index should be between 23-30. Your Body mass index is 31.93 kg/m. If this is out of the aforementioned range listed, please consider follow up with your Primary Care Provider.  If you are age 67 or younger, your body mass index should be between 19-25. Your Body mass index is 31.93 kg/m. If this is out of the aformentioned range listed, please consider follow up with your Primary Care Provider.   ________________________________________________________  The [facility] providers would like to encourage you to use MYCHART to communicate with providers for non-urgent requests or questions.  Due to long hold times on the telephone, sending your provider a message by [facility] may be a faster and more efficient way to get a response.  Please allow 48 business hours for a response.  Please remember that this is for non-urgent requests.  _______________________________________________________  I appreciate the opportunity to care for you. Loy Ruff, MD, [facility]

## 2024-04-03 ENCOUNTER — Other Ambulatory Visit: Payer: Self-pay | Admitting: Internal Medicine

## 2024-04-04 ENCOUNTER — Encounter: Payer: Self-pay | Admitting: Internal Medicine

## 2024-04-04 ENCOUNTER — Other Ambulatory Visit: Payer: Self-pay

## 2024-04-04 MED ORDER — LOPERAMIDE HCL 2 MG PO CAPS: 2.0000 mg | ORAL_CAPSULE | Freq: Two times a day (BID) | ORAL | 0 refills | Status: AC | PRN

## 2024-04-04 MED ORDER — ONDANSETRON HCL 4 MG PO TABS: 4.0000 mg | ORAL_TABLET | Freq: Four times a day (QID) | ORAL | 0 refills | Status: AC | PRN

## 2024-04-05 NOTE — Telephone Encounter (Signed)
 Spoke to patient yesterday and clarified.  The diarrhea had come down he was having a little bit of nausea.  I advised him to take his infliximab today unless he was much worse or had new problems.  I think that his diarrhea issue could be coming from his Rybelsus as he does but he needs to sort that out.  He is holding that currently.  There is a chance that the diarrhea and symptoms are coming from his inflammatory bowel disease though my clinical suspicion has been not so.  He is to update me.

## 2024-05-27 ENCOUNTER — Encounter: Payer: Self-pay | Admitting: Internal Medicine

## 2024-06-13 ENCOUNTER — Encounter: Payer: Self-pay | Admitting: Internal Medicine

## 2024-06-13 ENCOUNTER — Ambulatory Visit: Admitting: Internal Medicine

## 2024-06-13 VITALS — BP 136/86 | HR 66 | Ht 68.0 in | Wt 215.0 lb

## 2024-06-13 DIAGNOSIS — Z5181 Encounter for therapeutic drug level monitoring: Secondary | ICD-10-CM | POA: Diagnosis not present

## 2024-06-13 DIAGNOSIS — L989 Disorder of the skin and subcutaneous tissue, unspecified: Secondary | ICD-10-CM | POA: Diagnosis not present

## 2024-06-13 DIAGNOSIS — Z79620 Long term (current) use of immunosuppressive biologic: Secondary | ICD-10-CM | POA: Diagnosis not present

## 2024-06-13 DIAGNOSIS — Z7960 Long term (current) use of unspecified immunomodulators and immunosuppressants: Secondary | ICD-10-CM

## 2024-06-13 DIAGNOSIS — K5130 Ulcerative (chronic) rectosigmoiditis without complications: Secondary | ICD-10-CM | POA: Diagnosis not present

## 2024-06-13 NOTE — Progress Notes (Signed)
 Richard Carey 63 y.o. 1961-06-19 [id number]  Assessment & Plan:   Encounter Diagnoses  Name Primary?   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  It does seem like Rybelsus was causing a gastroenteritis like syndrome and that has resolved since he discontinued it.  He is to resume his mercaptopurine  and we will check labs the following up in 3 months.  A fecal calprotectin will be checked then as well.  Timing of follow-up and repeat colonoscopy to be determined then.  He will continue his adalimumab  biosimilar as well.  He has a scaly skin lesion on the right arm and I placed a photograph in the chart, I think this is likely benign but I have recommended he return to primary care for evaluation and hopefully removal.  Orders Placed This Encounter  Procedures   Calprotectin, Fecal   CBC with Differential/Platelet   Hepatic function panel   CC: Nanci Senior, MD      Subjective:  Gastroenterology summary:   Left-sided ulcerative colitis:   February 14, 2023-colonoscopy for rectal bleeding, findings suggestive of segmental colitis associated with diverticulosis and hemorrhoids.  Treated with Cipro  and metronidazole  but had persistent bleeding issues despite treatment for hemorrhoids as well.  Then treated with prednisone  in summer 2024 with benefit.   Colonoscopy 08/08/2023 with left-sided ulcerative colitis, biopsies this time show changes of chronic colitis.  Treatment with mesalamine  in the form of Lialda  2.4 g daily.  Then in November flared with bleeding and some diarrhea and prednisone  taper restarted after a brief period of time on mesalamine  4.8 g daily.   November 2024-flaring, moved to 4.8 g mesalamine  but not helpful and required prednisone  which was continued into December and January and February 2025  6-MP 50 mg  daily in anticipation of concomitant adalimumab  started January 2025     IBD treatment timeline:   Summer 2024 prednisone  used, working diagnosis with [facility] September 2024 mesalamine  started 2.4 g Lialda  daily then increased to 4.8 g daily but required more prednisone  January 2025 6-MP 50 mg daily started and adalimumab  prescribed but not started January 31, 2024 adalimumab  begun prednisone  tapering Spring 2025-nausea vomiting diarrhea secondary to Rybelsus ------------------------------------------------------------------------------------------------------------   Chief Complaint: Follow-up of ulcerative colitis  HPI Richard Carey is a 63 year old male with left-sided ulcerative colitis as summarized above and diabetes mellitus who presents for follow-up.  He was last seen in May at which point he had been having nausea vomiting and diarrhea which was thought due to Rybelsus.  He returned to primary care Rybelsus was stopped and the symptoms have not returned, and it was concluded that that was a side effect of that medication.  He is not having diarrhea currently with bowel movements ranging from formed to soft, occurring one to two times per day, with occasional constipation. He had been off schedule with Amjevita , missing a dose or two, but has since resumed regular dosing. He also stopped taking mercaptopurine  when he was ill and has not restarted it yet. He was on a fast taper of prednisone  due to a wasp sting in early July, which caused significant swelling and pain in his hand, and that is completely resolved.  Regarding his diabetes, blood sugars have improved since stopping prednisone  which was used earlier this year for his colitis,, with a recent reading of 145 mg/dL after  lunch.   He mentions a skin lesion on his right arm that appeared months ago, described as a 'skin tear,' but he has not yet shown it to his primary care doctor. He suspects it may have occurred while moving his  daughter. No new rashes or other health issues. Allergies  Allergen Reactions   Keflex [Cephalexin] Hives   Tussin Dm [Guaifenesin-Dm]    Current Meds  Medication Sig   Adalimumab -atto (AMJEVITA ) 40 MG/0.4ML SOAJ Inject 40 mg into the skin every 14 (fourteen) days.   diphenhydramine-acetaminophen  (TYLENOL  PM) 25-500 MG TABS tablet Take 1 tablet by mouth at bedtime as needed.   hyoscyamine  (LEVBID ) 0.375 MG 12 hr tablet TAKE 1 TABLET (0.375 MG TOTAL) BY MOUTH DAILY AS NEEDED.   loperamide  (IMODIUM ) 2 MG capsule Take 1 capsule (2 mg total) by mouth every 12 (twelve) hours as needed for diarrhea or loose stools.   ondansetron  (ZOFRAN ) 4 MG tablet Take 1 tablet (4 mg total) by mouth every 6 (six) hours as needed for nausea or vomiting.   Past Medical History:  Diagnosis Date   Acute medial meniscus tear of right knee    Allergy    Ankle pain, right    Left sided ulcerative colitis [facility])    Past Surgical History:  Procedure Laterality Date   COLONOSCOPY     KNEE ARTHROSCOPY Right 09/09/2015   Procedure: RIGHT ARTHROSCOPY KNEE;  Surgeon: Dempsey Sensor, MD;  Location: [facility];  Service: Orthopedics;  Laterality: Right;   LYMPH NODE BIOPSY     left side neck   MOUTH SURGERY     NASAL SINUS SURGERY     sinus scraped /1991   TONSILLECTOMY     TOOTH EXTRACTION     Social History   Social History Narrative   Patient is married he has 1 daughter (she has Crohn's disease).  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   No alcohol never smoker drinks many cups of tea each day.   family history includes Aneurysm in his paternal grandfather.   Review of Systems As per HPI  Objective:   Physical Exam @BP  136/86 (BP Location: Left Arm, Patient Position: Sitting, Cuff Size: Large)   Pulse 66   Ht 5' 8 (1.727 m)   Wt 215 lb (97.5 kg)   BMI 32.69 kg/m @  General:  NAD Eyes:   anicteric Lungs:  clear Heart::  S1S2 no rubs, murmurs or  gallops Abdomen:  soft and nontender, BS+ Ext:   no edema, cyanosis or clubbing  Skin lesion R arm, crusty/scaly small   Data Reviewed: See HPI  I spent 36 minutes of time, including in depth chart review, independent review of results as outlined above, communicating results with the patient directly, face-to-face time with the patient, coordinating care, ordering studies and medications as appropriate, and documentation.

## 2024-06-13 NOTE — Patient Instructions (Signed)
 Get back on your . Let the pharmacy know when you need refills and they will contact us .  The first week of October come and have labs drawn. No appointment is needed. The lab is open Mon-Friday 7:30am-5:30pm. Go down today and pick up the stool container to bring back in October. We will contact you with results and plans after the results come back.  I appreciate the opportunity to care for you. Lupita Commander, MD, [facility]

## 2024-06-18 ENCOUNTER — Other Ambulatory Visit: Payer: Self-pay | Admitting: Internal Medicine

## 2024-06-18 ENCOUNTER — Telehealth: Payer: Self-pay

## 2024-06-18 ENCOUNTER — Other Ambulatory Visit (HOSPITAL_COMMUNITY): Payer: Self-pay

## 2024-06-18 NOTE — Telephone Encounter (Signed)
 Pharmacy Patient Advocate Encounter   Received notification from CoverMyMeds that prior authorization for Amjevita  40MG /0.4ML auto-injectors is required/requested.   Insurance verification completed.   The patient is insured through [facility] .   Per test claim: PA required; PA submitted to above mentioned insurance via CoverMyMeds Key/confirmation #/EOC [id number] Status is pending

## 2024-06-20 ENCOUNTER — Other Ambulatory Visit (HOSPITAL_COMMUNITY): Payer: Self-pay

## 2024-06-20 NOTE — Telephone Encounter (Signed)
 Noted pt. advised

## 2024-06-20 NOTE — Telephone Encounter (Signed)
 Pharmacy Patient Advocate Encounter  Received notification from [employer] that Prior Authorization for Amjevita  40MG /0.4ML auto-injectors has been APPROVED from 0729-2025 to 06-18-2025. Must be filled at [facility]   PA #/Case ID/Reference #: [id number]

## 2024-08-01 ENCOUNTER — Other Ambulatory Visit: Payer: Self-pay | Admitting: Internal Medicine

## 2024-08-01 ENCOUNTER — Encounter: Payer: Self-pay | Admitting: Internal Medicine

## 2024-08-01 MED ORDER — HYDROCORTISONE ACETATE 25 MG RE SUPP: RECTAL | 0 refills | Status: AC

## 2024-08-02 NOTE — Telephone Encounter (Signed)
 Attempted to call pt to schedule f/u ov with CG for next week. No answer, left vm for pt to return call.

## 2024-08-08 ENCOUNTER — Ambulatory Visit: Payer: Self-pay | Admitting: Internal Medicine

## 2024-08-08 ENCOUNTER — Ambulatory Visit: Admitting: Internal Medicine

## 2024-08-08 ENCOUNTER — Other Ambulatory Visit (INDEPENDENT_AMBULATORY_CARE_PROVIDER_SITE_OTHER)

## 2024-08-08 ENCOUNTER — Encounter: Payer: Self-pay | Admitting: Internal Medicine

## 2024-08-08 ENCOUNTER — Other Ambulatory Visit: Payer: Self-pay | Admitting: Internal Medicine

## 2024-08-08 VITALS — BP 130/80 | HR 77 | Ht 68.0 in | Wt 218.1 lb

## 2024-08-08 DIAGNOSIS — Z7960 Long term (current) use of unspecified immunomodulators and immunosuppressants: Secondary | ICD-10-CM

## 2024-08-08 DIAGNOSIS — K51311 Ulcerative (chronic) rectosigmoiditis with rectal bleeding: Secondary | ICD-10-CM

## 2024-08-08 LAB — COMPREHENSIVE METABOLIC PANEL WITH GFR
ALT: 15 U/L (ref 0–53)
AST: 14 U/L (ref 0–37)
Albumin: 4.4 g/dL (ref 3.5–5.2)
Alkaline Phosphatase: 76 U/L (ref 39–117)
BUN: 23 mg/dL (ref 6–23)
CO2: 30 meq/L (ref 19–32)
Calcium: 9.8 mg/dL (ref 8.4–10.5)
Chloride: 101 meq/L (ref 96–112)
Creatinine, Ser: 1.21 mg/dL (ref 0.40–1.50)
GFR: 63.39 mL/min (ref >60.00)
Glucose, Bld: 125 mg/dL — ABNORMAL HIGH (ref 70–99)
Potassium: 4.1 meq/L (ref 3.5–5.1)
Sodium: 138 meq/L (ref 135–145)
Total Bilirubin: 0.4 mg/dL (ref 0.2–1.2)
Total Protein: 7 g/dL (ref 6.0–8.3)

## 2024-08-08 LAB — CBC WITH DIFFERENTIAL/PLATELET
Basophils Absolute: 0 K/uL (ref 0.0–0.1)
Basophils Relative: 0.2 % (ref 0.0–3.0)
Eosinophils Absolute: 0.2 K/uL (ref 0.0–0.7)
Eosinophils Relative: 3.1 % (ref 0.0–5.0)
HCT: 43 % (ref 39.0–52.0)
Hemoglobin: 14.5 g/dL (ref 13.0–17.0)
Lymphocytes Relative: 20.6 % (ref 12.0–46.0)
Lymphs Abs: 1.2 K/uL (ref 0.7–4.0)
MCHC: 33.6 g/dL (ref 30.0–36.0)
MCV: 84 fl (ref 78.0–100.0)
Monocytes Absolute: 0.7 K/uL (ref 0.1–1.0)
Monocytes Relative: 11.7 % (ref 3.0–12.0)
Neutro Abs: 3.8 K/uL (ref 1.4–7.7)
Neutrophils Relative %: 64.4 % (ref 43.0–77.0)
Platelets: 242 K/uL (ref 150.0–400.0)
RBC: 5.12 Mil/uL (ref 4.22–5.81)
RDW: 15.6 % — ABNORMAL HIGH (ref 11.5–15.5)
WBC: 5.9 K/uL (ref 4.0–10.5)

## 2024-08-08 LAB — C-REACTIVE PROTEIN: CRP: <1 mg/dL (ref 0.5–20.0)

## 2024-08-08 LAB — SEDIMENTATION RATE: Sed Rate: 12 mm/h (ref 0–20)

## 2024-08-08 MED ORDER — BUDESONIDE ER 9 MG PO TB24: 9.0000 mg | ORAL_TABLET | Freq: Every day | ORAL | 0 refills | Status: DC

## 2024-08-08 NOTE — Patient Instructions (Addendum)
 Please go to the lab in the basement of our building to have lab work done as you leave today. Hit B for basement when you get on the elevator.  When the doors open the lab is on your left.  We will call you with the results. Thank you.  We have sent the following medications to your pharmacy for you to pick up at your convenience:' Uceris  9 mg: Take once daily If too expensive, start Prednisone  20 mg daily for 1 week then reduce to 15 mg daily for 1 week.  Thank you for entrusting me with your care and for choosing [facility], Dr. Lupita Commander   _______________________________________________________  If your blood pressure at your visit was 140/90 or greater, please contact your primary care physician to follow up on this.  _______________________________________________________  If you are age 64 or older, your body mass index should be between 23-30. Your Body mass index is 33.17 kg/m. If this is out of the aforementioned range listed, please consider follow up with your Primary Care Provider.  If you are age 64 or younger, your body mass index should be between 19-25. Your Body mass index is 33.17 kg/m. If this is out of the aformentioned range listed, please consider follow up with your Primary Care Provider.   ________________________________________________________  The [facility] providers would like to encourage you to use MYCHART to communicate with providers for non-urgent requests or questions.  Due to long hold times on the telephone, sending your provider a message by [facility] may be a faster and more efficient way to get a response.  Please allow 48 business hours for a response.  Please remember that this is for non-urgent requests.  _______________________________________________________  [facility] is using a team-based approach to care.  Your team is made up of your doctor and two to three APPS. Our APPS (Nurse Practitioners and Physician  Assistants) work with your physician to ensure care continuity for you. They are fully qualified to address your health concerns and develop a treatment plan. They communicate directly with your gastroenterologist to care for you. Seeing the Advanced Practice Practitioners on your physician's team can help you by facilitating care more promptly, often allowing for earlier appointments, access to diagnostic testing, procedures, and other specialty referrals. '

## 2024-08-08 NOTE — Progress Notes (Signed)
 Richard Carey 63 y.o. [birth year] [id number]  Assessment & Plan:   Encounter Diagnoses  Name Primary?   Ulcerative rectosigmoiditis with rectal bleeding (HCC) Yes   Long-term use of immunosuppressant medications -6-mercaptopurine , adalimumab     I think the patient is flaring given rectal bleeding and increased bowel habits some of which were urgent.  Given his significant problems of hyperglycemia on prednisone  would like to avoid using that.   Try Uceris  9 mg daily, if unable to purchase that then start prednisone  20 mg daily x 1 week 15 mg daily x 1 week and then we will adjust  Will evaluate with the following labs: Orders Placed This Encounter  Procedures   Calprotectin, Fecal   CBC with Differential/Platelet   C-reactive protein   Sedimentation rate   Comprehensive metabolic panel with GFR   Once I see these results we will have to determine next steps.  Will need to determine if he has adequate adalimumab  levels, with a trough blood test also looking for development of antibodies.  He will continue his adalimumab  every other week at this point and also his 6-MP.  He has some work travel coming so we will have to most likely wait beyond the next dose of adalimumab  to get trough levels and antibodies.  We will coordinate.  CC: Nanci Senior, MD    Subjective:  Gastroenterology summary:   Left-sided ulcerative colitis:   February 14, 2023-colonoscopy for rectal bleeding, findings suggestive of segmental colitis associated with diverticulosis and hemorrhoids.  Treated with Cipro  and metronidazole  but had persistent bleeding issues despite treatment for hemorrhoids as well.  Then treated with prednisone  in summer 2024 with benefit.   Colonoscopy 08/08/2023 with left-sided ulcerative colitis, biopsies this time show changes of chronic colitis.  Treatment with mesalamine  in the form of Lialda  2.4 g daily.  Then in November flared with bleeding and some diarrhea and  prednisone  taper restarted after a brief period of time on mesalamine  4.8 g daily.   November 2024-flaring, moved to 4.8 g mesalamine  but not helpful and required prednisone  which was continued into December and January and February 2025  6-MP 50 mg daily in anticipation of concomitant adalimumab  started January 2025     IBD treatment timeline:   Summer 2024 prednisone  used, working diagnosis with [facility] September 2024 mesalamine  started 2.4 g Lialda  daily then increased to 4.8 g daily but required more prednisone  January 2025 6-MP 50 mg daily started and adalimumab  prescribed but not started January 31, 2024 adalimumab  begun prednisone  tapering Spring 2025-nausea vomiting diarrhea secondary to Rybelsus   --------------------------------------------------------------------------  Chief Complaint: Diarrhea and rectal bleeding, patient with ulcerative rectosigmoiditis  HPI 63 year old man with ulcerative rectosigmoiditis on adalimumab  and 6-MP, he messaged me last week noting that he had some increase in stool frequency at times and some rectal bleeding.  Not as bad as in the past.  I prescribed hydrocortisone  suppositories to be taken at night he started those 3 or 4 days ago.  He has had urgency with defecation and small-volume stools and some tenesmus and some bright red blood with wiping and also some streaked into bowel movements.  Mild abdominal pain in the left lower quadrant.  Stools are generally soft he has had a couple of explosive defecation episodes he says.  He is taking his adalimumab  on schedule with last dose yesterday, and 6-MP daily.  He was last seen here July 24 he had been off schedule with his Amjevita  (adalimumab ) but got back on  it.  He was also off 6-MP briefly but resumed.  He has not had any recent antibiotics. Allergies  Allergen Reactions   Keflex [Cephalexin] Hives   Other Nausea Only    Tussionex   Current Meds  Medication Sig   Adalimumab -atto (AMJEVITA ) 40  MG/0.4ML SOAJ Inject 40 mg into the skin every 14 (fourteen) days.   Continuous Glucose Sensor (DEXCOM G7 SENSOR) MISC as directed.   diphenhydramine-acetaminophen  (TYLENOL  PM) 25-500 MG TABS tablet Take 1 tablet by mouth at bedtime as needed.   hydrocortisone  (ANUSOL -HC) 25 MG suppository At bedtime x 7 nights then as needed for rectal bleeidng   hyoscyamine  (LEVBID ) 0.375 MG 12 hr tablet TAKE 1 TABLET (0.375 MG TOTAL) BY MOUTH DAILY AS NEEDED.   JARDIANCE 25 MG TABS tablet Take 25 mg by mouth daily.   loperamide  (IMODIUM ) 2 MG capsule Take 1 capsule (2 mg total) by mouth every 12 (twelve) hours as needed for diarrhea or loose stools.   mercaptopurine  (PURINETHOL ) 50 MG tablet TAKE 1 TABLET DAILY ON AN EMPTY STOMACH 1 HOUR BEFORE OR 2 HOURS AFTER MEALS. CAUTION-CHEMOTHERAPY.   ondansetron  (ZOFRAN ) 4 MG tablet Take 1 tablet (4 mg total) by mouth every 6 (six) hours as needed for nausea or vomiting.   Past Medical History:  Diagnosis Date   Acute medial meniscus tear of right knee    Allergy    Ankle pain, right    Left sided ulcerative colitis [facility])    Past Surgical History:  Procedure Laterality Date   COLONOSCOPY     KNEE ARTHROSCOPY Right 09/09/2015   Procedure: RIGHT ARTHROSCOPY KNEE;  Surgeon: Dempsey Sensor, MD;  Location[facility]UR[facility]ERY [facility];  Service: Orthopedics;  Laterality: Right;   LYMPH NODE BIOPSY     left side neck   MOUTH SURGERY     NASAL SINUS SURGERY     sinus scraped /1991   TONSILLECTOMY     TOOTH EXTRACTION     Social History   Social History Narrative   Patient is married he has 1 daughter (she has Crohn's disease).  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   No alcohol never smoker drinks many cups of tea each day.   family history includes Aneurysm in his paternal grandfather.   Review of Systems See HPI  Objective:   Physical Exam BP 130/80   Pulse 77   Ht 5' 8 (1.727 m)   Wt 218 lb 2 oz (98.9 kg)   BMI 33.17  kg/m  Well-developed  man no acute distress Lungs clear Heart sounds normal S1-S2 no rubs rubs or gallops Abdomen is  soft and nontender bowel sounds present Rectal slightly tender palpable hemorrhoids no mass soft brown stool no blood  Anoscopy does show some internal hemorrhoids with stigmata of bleeding (slight), look to be grade 1.  I cannot see any proctitis.

## 2024-08-12 ENCOUNTER — Telehealth: Payer: Self-pay

## 2024-08-12 ENCOUNTER — Other Ambulatory Visit (HOSPITAL_COMMUNITY): Payer: Self-pay

## 2024-08-12 ENCOUNTER — Other Ambulatory Visit

## 2024-08-12 DIAGNOSIS — K51311 Ulcerative (chronic) rectosigmoiditis with rectal bleeding: Secondary | ICD-10-CM

## 2024-08-12 DIAGNOSIS — Z7960 Long term (current) use of unspecified immunomodulators and immunosuppressants: Secondary | ICD-10-CM

## 2024-08-12 NOTE — Telephone Encounter (Signed)
 Pharmacy Patient Advocate Encounter   Received notification from RX Request Messages that prior authorization for Budesonide  ER 9MG  er tablets is required/requested.   Insurance verification completed.   The patient is insured through [facility] .   Per test claim: PA required; PA submitted to above mentioned insurance via Latent Key/confirmation #/EOC [id number] Status is pending

## 2024-08-12 NOTE — Telephone Encounter (Signed)
 PA request has been Submitted. New Encounter has been or will be created for follow up. For additional info see Pharmacy Prior Auth telephone encounter from 07-23-2024.

## 2024-08-13 NOTE — Telephone Encounter (Signed)
 PA has been submitted and documented in separate encounter, please sign off on rx in this encounter as PA team is unable to resolve RX requests. Thank you

## 2024-08-13 NOTE — Telephone Encounter (Signed)
 Pharmacy Patient Advocate Encounter  Additional information has been requested from the patient's insurance in order to proceed with the prior authorization request. Requested information has been sent, or form has been filled out and faxed back to [employer]

## 2024-08-14 ENCOUNTER — Other Ambulatory Visit (HOSPITAL_COMMUNITY): Payer: Self-pay

## 2024-08-14 LAB — CALPROTECTIN, FECAL: Calprotectin, Fecal: >8000 ug/g — ABNORMAL HIGH (ref 0–120)

## 2024-08-14 NOTE — Telephone Encounter (Signed)
 Noted pt. advised

## 2024-08-14 NOTE — Telephone Encounter (Signed)
 Pharmacy Patient Advocate Encounter  Received notification from [employer] that Prior Authorization for Budesonide  ER 9MG  er tablets has been APPROVED from 08-14-2024 to 02-09-2025. Ran test claim, Copay is $0.00. This test claim was processed through [facility]- copay amounts may vary at other pharmacies due to pharmacy/plan contracts, or as the patient moves through the different stages of their insurance plan.   PA #/Case ID/Reference #: [id number]

## 2024-08-16 ENCOUNTER — Telehealth: Payer: Self-pay | Admitting: Internal Medicine

## 2024-08-16 ENCOUNTER — Encounter: Payer: Self-pay | Admitting: Internal Medicine

## 2024-08-16 MED ORDER — PREDNISONE 5 MG PO TABS: ORAL_TABLET | ORAL | 0 refills | Status: AC

## 2024-08-16 NOTE — Telephone Encounter (Signed)
 Attempted to reach pt to discuss no show appointment. No answer, left vm. Pt does communicate through my chart, message sent to pt.

## 2024-08-16 NOTE — Telephone Encounter (Signed)
 Inbound call from patient stating that he received a notification on a no showed lab appointment but he was not aware of any labs order. Patient is requesting a call back. Please advise.

## 2024-08-28 ENCOUNTER — Telehealth: Payer: Self-pay | Admitting: Internal Medicine

## 2024-08-28 DIAGNOSIS — K51311 Ulcerative (chronic) rectosigmoiditis with rectal bleeding: Secondary | ICD-10-CM

## 2024-08-28 DIAGNOSIS — Z7960 Long term (current) use of unspecified immunomodulators and immunosuppressants: Secondary | ICD-10-CM

## 2024-08-28 NOTE — Telephone Encounter (Signed)
 Yes I do.  I went ahead and ordered it.  I called him because I needed to review some things.   We reviewed that I was thinking of switching to Rinvoq.  We have decided to proceed with the adalimumab  level and revisit after that.

## 2024-08-28 NOTE — Telephone Encounter (Signed)
 Inbound call from patient stating he is scheduled for Amjevita  injection on 09/04/24 and Dr.Gessner requested him to get labs done before injection. Patient stated he would like to speak to nurse to see if those labs can be put in or if its needed. Requesting a call back  Please advise  Thank you

## 2024-08-28 NOTE — Telephone Encounter (Signed)
 Are you wanting to do adalimumab  level on Richard Carey?

## 2024-09-03 ENCOUNTER — Other Ambulatory Visit

## 2024-09-03 DIAGNOSIS — K51311 Ulcerative (chronic) rectosigmoiditis with rectal bleeding: Secondary | ICD-10-CM

## 2024-09-03 DIAGNOSIS — Z7960 Long term (current) use of unspecified immunomodulators and immunosuppressants: Secondary | ICD-10-CM

## 2024-09-09 LAB — SERIAL MONITORING

## 2024-09-11 ENCOUNTER — Ambulatory Visit: Payer: Self-pay | Admitting: Internal Medicine

## 2024-09-11 LAB — ADALIMUMAB+AB (SERIAL MONITOR)
Adalimumab Drug Level: 8 ug/mL
Anti-Adalimumab Antibody: <25 ng/mL

## 2024-09-11 NOTE — Telephone Encounter (Signed)
 Called to explain that drug levels of adalimumab  are adequate or nearly adequate and given that he has required multiple rounds of prednisone  since being on that medication and mercaptopurine  I think he is failing the drug.   We have elected to change to Rinvoq.  Side effects have been reviewed before and again today.  I have again recommended he get the Shingrix vaccine.  He should continue Amjevita  in the interim as well as continuing 20 mg of prednisone  and when stools are more formed (still loose and somewhat explosive but not bleeding) go down to 15 mg daily.

## 2024-09-17 ENCOUNTER — Other Ambulatory Visit: Payer: Self-pay | Admitting: Internal Medicine

## 2024-09-17 NOTE — Telephone Encounter (Signed)
 Patient has persistent signs and symptoms of ulcerative proctosigmoiditis despite using adalimumab  and 6-MP.  He has failed these medications.  I have reviewed this with him and we have elected to change to Rinvoq and prescriptions are pended for prior authorization submission.

## 2024-09-18 ENCOUNTER — Telehealth: Payer: Self-pay

## 2024-09-18 ENCOUNTER — Other Ambulatory Visit (HOSPITAL_COMMUNITY): Payer: Self-pay

## 2024-09-18 MED ORDER — RINVOQ 15 MG PO TB24: 15.0000 mg | ORAL_TABLET | Freq: Every day | ORAL | 3 refills | Status: AC

## 2024-09-18 MED ORDER — RINVOQ 45 MG PO TB24: 45.0000 mg | ORAL_TABLET | Freq: Every day | ORAL | 1 refills | Status: AC

## 2024-09-18 NOTE — Telephone Encounter (Signed)
 Pharmacy Patient Advocate Encounter   Received notification from [employer] that prior authorization for Rinvoq 45MG  er tablets is required/requested.   Insurance verification completed.   The patient is insured through [facility].   Per test claim: PA required; PA submitted to above mentioned insurance via Latent Key/confirmation #/EOC [id number] Status is pending

## 2024-09-18 NOTE — Telephone Encounter (Signed)
 Pharmacy Patient Advocate Encounter  Received notification from [facility] that Prior Authorization for Rinvoq 45MG  er tablets has been APPROVED from 09-17-2024 to 03-19-2025  Must fill at [facility]  PA #/Case ID/Reference #: [id number]

## 2024-09-18 NOTE — Telephone Encounter (Signed)
 Noted pt. advised

## 2024-10-16 ENCOUNTER — Other Ambulatory Visit: Payer: Self-pay | Admitting: Internal Medicine
